# Patient Record
Sex: Female | Born: 1935 | Race: White | Hispanic: No | Marital: Married | Smoking: Never smoker
Health system: Southern US, Community
[De-identification: ages and names within clinical notes are randomized; demographics above are authoritative.]

## PROBLEM LIST (undated history)

## (undated) DIAGNOSIS — C801 Malignant (primary) neoplasm, unspecified: Secondary | ICD-10-CM

## (undated) DIAGNOSIS — K922 Gastrointestinal hemorrhage, unspecified: Secondary | ICD-10-CM

## (undated) DIAGNOSIS — Z9889 Other specified postprocedural states: Secondary | ICD-10-CM

## (undated) DIAGNOSIS — I639 Cerebral infarction, unspecified: Secondary | ICD-10-CM

## (undated) DIAGNOSIS — M1990 Unspecified osteoarthritis, unspecified site: Secondary | ICD-10-CM

## (undated) HISTORY — DX: Cerebral infarction, unspecified: I63.9

## (undated) HISTORY — PX: OTHER SURGICAL HISTORY: SHX169

## (undated) HISTORY — PX: CHOLECYSTECTOMY: SHX55

## (undated) HISTORY — PX: TONSILLECTOMY: SUR1361

## (undated) HISTORY — DX: Malignant (primary) neoplasm, unspecified: C80.1

## (undated) HISTORY — DX: Gastrointestinal hemorrhage, unspecified: K92.2

## (undated) HISTORY — DX: Other specified postprocedural states: Z98.890

## (undated) HISTORY — DX: Unspecified osteoarthritis, unspecified site: M19.90

---

## 1972-01-17 DIAGNOSIS — C801 Malignant (primary) neoplasm, unspecified: Secondary | ICD-10-CM

## 1972-01-17 HISTORY — DX: Malignant (primary) neoplasm, unspecified: C80.1

## 1997-04-23 ENCOUNTER — Encounter: Admission: RE | Admit: 1997-04-23 | Discharge: 1997-07-22 | Payer: Self-pay | Admitting: Infectious Diseases

## 1997-08-19 ENCOUNTER — Ambulatory Visit (HOSPITAL_COMMUNITY): Admission: RE | Admit: 1997-08-19 | Discharge: 1997-08-19 | Payer: Self-pay | Admitting: Internal Medicine

## 1998-10-29 ENCOUNTER — Encounter: Payer: Self-pay | Admitting: Internal Medicine

## 1998-10-29 ENCOUNTER — Ambulatory Visit (HOSPITAL_COMMUNITY): Admission: RE | Admit: 1998-10-29 | Discharge: 1998-10-29 | Payer: Self-pay | Admitting: Internal Medicine

## 2000-04-03 ENCOUNTER — Encounter: Admission: RE | Admit: 2000-04-03 | Discharge: 2000-04-20 | Payer: Self-pay | Admitting: Orthopedic Surgery

## 2000-07-26 ENCOUNTER — Other Ambulatory Visit: Admission: RE | Admit: 2000-07-26 | Discharge: 2000-07-26 | Payer: Self-pay | Admitting: Family Medicine

## 2000-08-30 ENCOUNTER — Encounter: Payer: Self-pay | Admitting: Urology

## 2000-08-31 ENCOUNTER — Ambulatory Visit (HOSPITAL_COMMUNITY): Admission: RE | Admit: 2000-08-31 | Discharge: 2000-08-31 | Payer: Self-pay | Admitting: Urology

## 2001-04-10 ENCOUNTER — Encounter: Admission: RE | Admit: 2001-04-10 | Discharge: 2001-04-10 | Payer: Self-pay | Admitting: Family Medicine

## 2001-04-10 ENCOUNTER — Encounter: Payer: Self-pay | Admitting: Family Medicine

## 2001-05-10 ENCOUNTER — Encounter: Admission: RE | Admit: 2001-05-10 | Discharge: 2001-05-10 | Payer: Self-pay | Admitting: Family Medicine

## 2001-05-10 ENCOUNTER — Encounter: Payer: Self-pay | Admitting: Family Medicine

## 2003-10-05 ENCOUNTER — Other Ambulatory Visit: Admission: RE | Admit: 2003-10-05 | Discharge: 2003-10-05 | Payer: Self-pay | Admitting: Family Medicine

## 2003-12-22 ENCOUNTER — Ambulatory Visit (HOSPITAL_COMMUNITY): Admission: RE | Admit: 2003-12-22 | Discharge: 2003-12-22 | Payer: Self-pay | Admitting: Gastroenterology

## 2003-12-22 ENCOUNTER — Encounter (INDEPENDENT_AMBULATORY_CARE_PROVIDER_SITE_OTHER): Payer: Self-pay | Admitting: *Deleted

## 2004-01-17 DIAGNOSIS — Z9889 Other specified postprocedural states: Secondary | ICD-10-CM

## 2004-01-17 HISTORY — DX: Other specified postprocedural states: Z98.890

## 2004-01-17 LAB — HM COLONOSCOPY: HM Colonoscopy: NORMAL

## 2004-06-23 ENCOUNTER — Ambulatory Visit (HOSPITAL_COMMUNITY): Admission: RE | Admit: 2004-06-23 | Discharge: 2004-06-23 | Payer: Self-pay | Admitting: *Deleted

## 2004-11-30 ENCOUNTER — Encounter: Payer: Self-pay | Admitting: Emergency Medicine

## 2004-12-01 ENCOUNTER — Encounter (INDEPENDENT_AMBULATORY_CARE_PROVIDER_SITE_OTHER): Payer: Self-pay | Admitting: *Deleted

## 2004-12-01 ENCOUNTER — Inpatient Hospital Stay (HOSPITAL_COMMUNITY): Admission: AD | Admit: 2004-12-01 | Discharge: 2004-12-02 | Payer: Self-pay | Admitting: *Deleted

## 2006-03-02 ENCOUNTER — Other Ambulatory Visit: Admission: RE | Admit: 2006-03-02 | Discharge: 2006-03-02 | Payer: Self-pay | Admitting: Family Medicine

## 2006-08-31 ENCOUNTER — Emergency Department (HOSPITAL_COMMUNITY): Admission: EM | Admit: 2006-08-31 | Discharge: 2006-09-01 | Payer: Self-pay | Admitting: Emergency Medicine

## 2006-09-12 ENCOUNTER — Encounter: Admission: RE | Admit: 2006-09-12 | Discharge: 2006-09-12 | Payer: Self-pay | Admitting: Sports Medicine

## 2006-12-05 ENCOUNTER — Encounter (INDEPENDENT_AMBULATORY_CARE_PROVIDER_SITE_OTHER): Payer: Self-pay | Admitting: Family Medicine

## 2007-03-06 ENCOUNTER — Ambulatory Visit: Payer: Self-pay | Admitting: Internal Medicine

## 2007-03-06 DIAGNOSIS — E119 Type 2 diabetes mellitus without complications: Secondary | ICD-10-CM

## 2007-03-06 DIAGNOSIS — Z8679 Personal history of other diseases of the circulatory system: Secondary | ICD-10-CM | POA: Insufficient documentation

## 2007-03-06 DIAGNOSIS — M1990 Unspecified osteoarthritis, unspecified site: Secondary | ICD-10-CM | POA: Insufficient documentation

## 2007-03-06 DIAGNOSIS — I10 Essential (primary) hypertension: Secondary | ICD-10-CM | POA: Insufficient documentation

## 2007-03-06 LAB — CONVERTED CEMR LAB
Blood in Urine, dipstick: NEGATIVE
Glucose, Urine, Semiquant: NEGATIVE
Ketones, urine, test strip: NEGATIVE
WBC Urine, dipstick: NEGATIVE

## 2007-03-13 LAB — CONVERTED CEMR LAB
ALT: 16 units/L (ref 0–35)
Albumin: 4.4 g/dL (ref 3.5–5.2)
Alkaline Phosphatase: 65 units/L (ref 39–117)
Basophils Absolute: 0.1 10*3/uL (ref 0.0–0.1)
Basophils Relative: 1 % (ref 0.0–1.0)
Bilirubin, Direct: 0.1 mg/dL (ref 0.0–0.3)
CO2: 25 meq/L (ref 19–32)
Calcium: 9.4 mg/dL (ref 8.4–10.5)
Chloride: 105 meq/L (ref 96–112)
Creatinine, Ser: 0.8 mg/dL (ref 0.4–1.2)
Eosinophils Absolute: 0 10*3/uL (ref 0.0–0.6)
Eosinophils Relative: 0.7 % (ref 0.0–5.0)
Glucose, Bld: 187 mg/dL — ABNORMAL HIGH (ref 70–99)
HCT: 44 % (ref 36.0–46.0)
Hemoglobin: 14.5 g/dL (ref 12.0–15.0)
Lymphocytes Relative: 26.3 % (ref 12.0–46.0)
MCV: 92.4 fL (ref 78.0–100.0)
RBC: 4.76 M/uL (ref 3.87–5.11)
RDW: 12.7 % (ref 11.5–14.6)
Sodium: 140 meq/L (ref 135–145)
TSH: 1.33 microintl units/mL (ref 0.35–5.50)
Total Bilirubin: 0.7 mg/dL (ref 0.3–1.2)
Total Protein: 7 g/dL (ref 6.0–8.3)

## 2007-03-25 ENCOUNTER — Ambulatory Visit: Payer: Self-pay | Admitting: Internal Medicine

## 2007-03-27 LAB — CONVERTED CEMR LAB
Creatinine, Ser: 0.7 mg/dL (ref 0.4–1.2)
GFR calc non Af Amer: 86 mL/min

## 2007-04-18 ENCOUNTER — Telehealth (INDEPENDENT_AMBULATORY_CARE_PROVIDER_SITE_OTHER): Payer: Self-pay | Admitting: *Deleted

## 2007-05-23 ENCOUNTER — Telehealth (INDEPENDENT_AMBULATORY_CARE_PROVIDER_SITE_OTHER): Payer: Self-pay | Admitting: *Deleted

## 2007-06-04 ENCOUNTER — Telehealth (INDEPENDENT_AMBULATORY_CARE_PROVIDER_SITE_OTHER): Payer: Self-pay | Admitting: *Deleted

## 2007-06-27 ENCOUNTER — Encounter: Payer: Self-pay | Admitting: Internal Medicine

## 2007-07-02 ENCOUNTER — Ambulatory Visit: Payer: Self-pay | Admitting: Internal Medicine

## 2007-07-03 ENCOUNTER — Telehealth (INDEPENDENT_AMBULATORY_CARE_PROVIDER_SITE_OTHER): Payer: Self-pay | Admitting: *Deleted

## 2007-07-03 LAB — CONVERTED CEMR LAB
HDL: 37.9 mg/dL — ABNORMAL LOW (ref >39.0)
Hgb A1c MFr Bld: 7.7 % — ABNORMAL HIGH (ref 4.6–6.0)
VLDL: 33 mg/dL (ref 0–40)

## 2007-07-08 ENCOUNTER — Encounter: Payer: Self-pay | Admitting: Internal Medicine

## 2007-07-12 ENCOUNTER — Telehealth (INDEPENDENT_AMBULATORY_CARE_PROVIDER_SITE_OTHER): Payer: Self-pay | Admitting: *Deleted

## 2007-08-07 ENCOUNTER — Telehealth: Payer: Self-pay | Admitting: Internal Medicine

## 2007-08-19 ENCOUNTER — Telehealth (INDEPENDENT_AMBULATORY_CARE_PROVIDER_SITE_OTHER): Payer: Self-pay | Admitting: *Deleted

## 2007-08-29 ENCOUNTER — Telehealth (INDEPENDENT_AMBULATORY_CARE_PROVIDER_SITE_OTHER): Payer: Self-pay | Admitting: *Deleted

## 2007-10-02 ENCOUNTER — Ambulatory Visit: Payer: Self-pay | Admitting: Internal Medicine

## 2007-10-02 DIAGNOSIS — E785 Hyperlipidemia, unspecified: Secondary | ICD-10-CM

## 2007-10-07 ENCOUNTER — Telehealth (INDEPENDENT_AMBULATORY_CARE_PROVIDER_SITE_OTHER): Payer: Self-pay | Admitting: *Deleted

## 2007-10-07 LAB — CONVERTED CEMR LAB
AST: 17 units/L (ref 0–37)
CO2: 26 meq/L (ref 19–32)
Chloride: 109 meq/L (ref 96–112)
Creatinine, Ser: 0.8 mg/dL (ref 0.4–1.2)
Hgb A1c MFr Bld: 7.5 % — ABNORMAL HIGH (ref 4.6–6.0)
Potassium: 3.6 meq/L (ref 3.5–5.1)
Sodium: 142 meq/L (ref 135–145)

## 2007-11-13 ENCOUNTER — Encounter: Payer: Self-pay | Admitting: Internal Medicine

## 2007-11-16 ENCOUNTER — Ambulatory Visit: Payer: Self-pay | Admitting: Cardiology

## 2007-11-17 ENCOUNTER — Ambulatory Visit: Payer: Self-pay | Admitting: Internal Medicine

## 2007-11-17 ENCOUNTER — Observation Stay (HOSPITAL_COMMUNITY): Admission: EM | Admit: 2007-11-17 | Discharge: 2007-11-18 | Payer: Self-pay | Admitting: Emergency Medicine

## 2007-11-17 ENCOUNTER — Ambulatory Visit: Payer: Self-pay | Admitting: Cardiology

## 2007-11-18 ENCOUNTER — Encounter: Payer: Self-pay | Admitting: Internal Medicine

## 2007-11-29 ENCOUNTER — Encounter: Payer: Self-pay | Admitting: Internal Medicine

## 2007-12-02 ENCOUNTER — Telehealth (INDEPENDENT_AMBULATORY_CARE_PROVIDER_SITE_OTHER): Payer: Self-pay | Admitting: *Deleted

## 2007-12-06 ENCOUNTER — Encounter (INDEPENDENT_AMBULATORY_CARE_PROVIDER_SITE_OTHER): Payer: Self-pay | Admitting: *Deleted

## 2007-12-19 ENCOUNTER — Ambulatory Visit: Payer: Self-pay | Admitting: Internal Medicine

## 2008-02-25 ENCOUNTER — Ambulatory Visit: Payer: Self-pay | Admitting: Internal Medicine

## 2008-03-02 ENCOUNTER — Ambulatory Visit: Payer: Self-pay | Admitting: Internal Medicine

## 2008-03-23 ENCOUNTER — Ambulatory Visit: Payer: Self-pay | Admitting: Internal Medicine

## 2008-03-25 ENCOUNTER — Telehealth: Payer: Self-pay | Admitting: Internal Medicine

## 2008-03-25 LAB — CONVERTED CEMR LAB
AST: 16 units/L (ref 0–37)
BUN: 16 mg/dL (ref 6–23)
CO2: 31 meq/L (ref 19–32)
Creatinine, Ser: 0.8 mg/dL (ref 0.4–1.2)
GFR calc Af Amer: 89 mL/min
GFR calc non Af Amer: 74 mL/min
Glucose, Bld: 98 mg/dL (ref 70–99)
Hgb A1c MFr Bld: 7.1 % — ABNORMAL HIGH (ref 4.6–6.0)
LDL Cholesterol: 119 mg/dL — ABNORMAL HIGH (ref 0–99)
Potassium: 5.4 meq/L — ABNORMAL HIGH (ref 3.5–5.1)
Sodium: 142 meq/L (ref 135–145)
VLDL: 32 mg/dL (ref 0–40)

## 2008-03-31 ENCOUNTER — Encounter (INDEPENDENT_AMBULATORY_CARE_PROVIDER_SITE_OTHER): Payer: Self-pay | Admitting: *Deleted

## 2008-03-31 ENCOUNTER — Encounter: Payer: Self-pay | Admitting: Internal Medicine

## 2008-04-08 ENCOUNTER — Telehealth: Payer: Self-pay | Admitting: Internal Medicine

## 2008-04-30 ENCOUNTER — Ambulatory Visit: Payer: Self-pay | Admitting: Internal Medicine

## 2008-05-04 ENCOUNTER — Telehealth (INDEPENDENT_AMBULATORY_CARE_PROVIDER_SITE_OTHER): Payer: Self-pay | Admitting: *Deleted

## 2008-05-12 ENCOUNTER — Telehealth (INDEPENDENT_AMBULATORY_CARE_PROVIDER_SITE_OTHER): Payer: Self-pay | Admitting: *Deleted

## 2008-05-12 ENCOUNTER — Ambulatory Visit: Payer: Self-pay | Admitting: Internal Medicine

## 2008-05-12 LAB — CONVERTED CEMR LAB
BUN: 14 mg/dL (ref 6–23)
CO2: 29 meq/L (ref 19–32)
Calcium: 9.4 mg/dL (ref 8.4–10.5)
Chloride: 108 meq/L (ref 96–112)
Creatinine, Ser: 0.7 mg/dL (ref 0.4–1.2)
GFR calc non Af Amer: 85.57 mL/min (ref >60)
Glucose, Bld: 144 mg/dL — ABNORMAL HIGH (ref 70–99)
Sodium: 145 meq/L (ref 135–145)

## 2008-05-13 ENCOUNTER — Ambulatory Visit: Payer: Self-pay | Admitting: Internal Medicine

## 2008-05-14 LAB — CONVERTED CEMR LAB: Potassium: 3.7 meq/L (ref 3.5–5.1)

## 2008-05-19 ENCOUNTER — Telehealth (INDEPENDENT_AMBULATORY_CARE_PROVIDER_SITE_OTHER): Payer: Self-pay | Admitting: *Deleted

## 2008-07-29 ENCOUNTER — Ambulatory Visit: Payer: Self-pay | Admitting: Internal Medicine

## 2008-07-29 DIAGNOSIS — M810 Age-related osteoporosis without current pathological fracture: Secondary | ICD-10-CM | POA: Insufficient documentation

## 2008-08-01 LAB — CONVERTED CEMR LAB
Basophils Absolute: 0.1 10*3/uL (ref 0.0–0.1)
Chloride: 103 meq/L (ref 96–112)
Eosinophils Relative: 1.7 % (ref 0.0–5.0)
GFR calc non Af Amer: 73.31 mL/min (ref >60)
HCT: 41.6 % (ref 36.0–46.0)
Hemoglobin: 14.3 g/dL (ref 12.0–15.0)
Hgb A1c MFr Bld: 6.8 % — ABNORMAL HIGH (ref 4.6–6.5)
Lymphs Abs: 1.7 10*3/uL (ref 0.7–4.0)
Neutro Abs: 3.2 10*3/uL (ref 1.4–7.7)
Potassium: 4.6 meq/L (ref 3.5–5.1)
Sodium: 140 meq/L (ref 135–145)
Vit D, 25-Hydroxy: 23 ng/mL — ABNORMAL LOW (ref 30–89)

## 2008-08-04 ENCOUNTER — Telehealth (INDEPENDENT_AMBULATORY_CARE_PROVIDER_SITE_OTHER): Payer: Self-pay | Admitting: *Deleted

## 2008-08-28 ENCOUNTER — Telehealth: Payer: Self-pay | Admitting: Internal Medicine

## 2008-08-31 ENCOUNTER — Ambulatory Visit: Payer: Self-pay | Admitting: Internal Medicine

## 2008-11-20 ENCOUNTER — Ambulatory Visit: Payer: Self-pay | Admitting: Internal Medicine

## 2008-11-20 DIAGNOSIS — L989 Disorder of the skin and subcutaneous tissue, unspecified: Secondary | ICD-10-CM | POA: Insufficient documentation

## 2008-11-24 ENCOUNTER — Telehealth (INDEPENDENT_AMBULATORY_CARE_PROVIDER_SITE_OTHER): Payer: Self-pay | Admitting: *Deleted

## 2008-11-24 LAB — CONVERTED CEMR LAB: AST: 14 units/L (ref 0–37)

## 2008-11-30 ENCOUNTER — Encounter: Payer: Self-pay | Admitting: Internal Medicine

## 2009-03-11 ENCOUNTER — Ambulatory Visit: Payer: Self-pay | Admitting: Internal Medicine

## 2009-03-15 LAB — CONVERTED CEMR LAB
AST: 17 units/L (ref 0–37)
BUN: 10 mg/dL (ref 6–23)
CO2: 30 meq/L (ref 19–32)
Calcium: 9.4 mg/dL (ref 8.4–10.5)
Chloride: 108 meq/L (ref 96–112)
Creatinine, Ser: 0.7 mg/dL (ref 0.4–1.2)
GFR calc non Af Amer: 85.39 mL/min (ref >60)
HDL: 50.4 mg/dL (ref >39.00)
LDL Cholesterol: 73 mg/dL (ref 0–99)
Potassium: 4.9 meq/L (ref 3.5–5.1)
Sodium: 144 meq/L (ref 135–145)
Total CHOL/HDL Ratio: 3
Triglycerides: 124 mg/dL (ref 0.0–149.0)

## 2009-03-22 ENCOUNTER — Telehealth (INDEPENDENT_AMBULATORY_CARE_PROVIDER_SITE_OTHER): Payer: Self-pay | Admitting: *Deleted

## 2009-03-24 ENCOUNTER — Encounter (INDEPENDENT_AMBULATORY_CARE_PROVIDER_SITE_OTHER): Payer: Self-pay | Admitting: *Deleted

## 2009-04-15 ENCOUNTER — Telehealth (INDEPENDENT_AMBULATORY_CARE_PROVIDER_SITE_OTHER): Payer: Self-pay | Admitting: *Deleted

## 2009-06-10 ENCOUNTER — Ambulatory Visit: Payer: Self-pay | Admitting: Internal Medicine

## 2009-08-16 DIAGNOSIS — K922 Gastrointestinal hemorrhage, unspecified: Secondary | ICD-10-CM

## 2009-08-16 HISTORY — DX: Gastrointestinal hemorrhage, unspecified: K92.2

## 2009-09-02 ENCOUNTER — Encounter: Payer: Self-pay | Admitting: Internal Medicine

## 2009-09-03 ENCOUNTER — Encounter: Payer: Self-pay | Admitting: Internal Medicine

## 2009-09-06 ENCOUNTER — Telehealth: Payer: Self-pay | Admitting: Internal Medicine

## 2009-09-09 ENCOUNTER — Ambulatory Visit: Payer: Self-pay | Admitting: Internal Medicine

## 2009-09-10 LAB — CONVERTED CEMR LAB
Eosinophils Absolute: 0.1 10*3/uL (ref 0.0–0.7)
HCT: 31.2 % — ABNORMAL LOW (ref 36.0–46.0)
Hemoglobin: 10.8 g/dL — ABNORMAL LOW (ref 12.0–15.0)
Iron: 35 ug/dL — ABNORMAL LOW (ref 42–145)
Lymphs Abs: 1.8 10*3/uL (ref 0.7–4.0)
MCV: 92.4 fL (ref 78.0–100.0)
Neutro Abs: 4.1 10*3/uL (ref 1.4–7.7)
RDW: 14.2 % (ref 11.5–14.6)
Saturation Ratios: 9.7 % — ABNORMAL LOW (ref 20.0–50.0)
Transferrin: 256.9 mg/dL (ref 212.0–360.0)
WBC: 6.5 10*3/uL (ref 4.5–10.5)

## 2009-09-14 ENCOUNTER — Ambulatory Visit: Payer: Self-pay | Admitting: Internal Medicine

## 2009-09-16 ENCOUNTER — Telehealth: Payer: Self-pay | Admitting: Internal Medicine

## 2009-09-21 ENCOUNTER — Ambulatory Visit: Payer: Self-pay | Admitting: Internal Medicine

## 2009-09-27 ENCOUNTER — Telehealth (INDEPENDENT_AMBULATORY_CARE_PROVIDER_SITE_OTHER): Payer: Self-pay | Admitting: *Deleted

## 2009-10-21 ENCOUNTER — Telehealth: Payer: Self-pay | Admitting: Internal Medicine

## 2009-10-22 ENCOUNTER — Telehealth (INDEPENDENT_AMBULATORY_CARE_PROVIDER_SITE_OTHER): Payer: Self-pay | Admitting: *Deleted

## 2009-10-27 ENCOUNTER — Encounter: Payer: Self-pay | Admitting: Internal Medicine

## 2009-10-29 ENCOUNTER — Ambulatory Visit: Payer: Self-pay | Admitting: Internal Medicine

## 2009-11-01 ENCOUNTER — Ambulatory Visit: Payer: Self-pay | Admitting: Internal Medicine

## 2009-11-19 ENCOUNTER — Telehealth: Payer: Self-pay | Admitting: Internal Medicine

## 2009-11-19 ENCOUNTER — Encounter: Payer: Self-pay | Admitting: Internal Medicine

## 2009-12-01 ENCOUNTER — Encounter: Payer: Self-pay | Admitting: Internal Medicine

## 2009-12-01 LAB — HM MAMMOGRAPHY: HM Mammogram: NORMAL

## 2010-02-10 ENCOUNTER — Ambulatory Visit: Admit: 2010-02-10 | Payer: Self-pay | Admitting: Internal Medicine

## 2010-02-16 ENCOUNTER — Other Ambulatory Visit: Payer: Self-pay | Admitting: Internal Medicine

## 2010-02-16 ENCOUNTER — Ambulatory Visit (INDEPENDENT_AMBULATORY_CARE_PROVIDER_SITE_OTHER): Payer: MEDICARE | Admitting: Internal Medicine

## 2010-02-16 ENCOUNTER — Encounter: Payer: Self-pay | Admitting: Internal Medicine

## 2010-02-16 DIAGNOSIS — E119 Type 2 diabetes mellitus without complications: Secondary | ICD-10-CM

## 2010-02-16 DIAGNOSIS — I10 Essential (primary) hypertension: Secondary | ICD-10-CM

## 2010-02-16 DIAGNOSIS — R3919 Other difficulties with micturition: Secondary | ICD-10-CM | POA: Insufficient documentation

## 2010-02-16 DIAGNOSIS — E785 Hyperlipidemia, unspecified: Secondary | ICD-10-CM

## 2010-02-16 LAB — CONVERTED CEMR LAB
Glucose, Urine, Semiquant: NEGATIVE
Specific Gravity, Urine: 1.015
WBC Urine, dipstick: NEGATIVE
pH: 6.5

## 2010-02-16 LAB — BASIC METABOLIC PANEL
CO2: 27 mEq/L (ref 19–32)
Chloride: 107 mEq/L (ref 96–112)
Sodium: 141 mEq/L (ref 135–145)

## 2010-02-16 LAB — LIPID PANEL
Cholesterol: 200 mg/dL (ref 0–200)
LDL Cholesterol: 132 mg/dL — ABNORMAL HIGH (ref 0–99)
Total CHOL/HDL Ratio: 5
Triglycerides: 131 mg/dL (ref 0.0–149.0)

## 2010-02-17 NOTE — Assessment & Plan Note (Signed)
Summary: 6 WEEK FOLLOWUP--FASTING LABS??///SPH   Vital Signs:  Patient profile:   77 year old female Weight:      215.50 pounds Pulse rate:   73 / minute Pulse rhythm:   regular BP sitting:   182 / 90  (left arm) Cuff size:   regular  Vitals Entered By: Army Fossa CMA (October 29, 2009 9:03 AM) CC: Pt here for f/u visit- fasting Comments Started losartan this am.  [facility]    History of Present Illness: routine office visit Have extensive paperwork for me to fill out.  ROS As far as her diabetes, she is taking 25 units in the morning and 20 at night. She only recalls her CBGs from yesterday morning (130) and today morning (150) since last ov had a single episode of low sugar today at 3 am   As far as her HTN,  today her BP  is elevated, a week ago at home her systolic BP was 139 and it usually stays in that range. Good compliance with Diovan, she took her last pill today. His  will  start losartan tonight.  She already stopped PPIs as recommended at the last visit. No GERD symptoms, no nausea vomiting or blood in the stools     Current Medications (verified): 1)  Simvastatin 20 Mg Tabs (Simvastatin) .... Take 1 Tablet By Mouth Once A Day 2)  Novolin 70/30 70-30 %  Susp (Insulin Isophane & Regular) .... 24 Units in Am, 20 Units At  Pm 3)  Furosemide 40 Mg  Tabs (Furosemide) .Marland Kitchen.. 1 By Mouth Once Daily 4)  Cartia Xt 300 Mg Xr24h-Cap (Diltiazem Hcl Coated Beads) .Marland Kitchen.. 1 By Mouth Once Daily 5)  Allegra 180 Mg  Tabs (Fexofenadine Hcl) .Marland Kitchen.. 1 By Mouth Once Daily 6)  Tylenol Arthritis Pain 650 Mg  Tbcr (Acetaminophen) .... By Mouth Every 6-7 Hours 7)  Calcium/d 600mg  .... Take 1 Tablet By Mouth Two Times A Day 8)  Flonase 50 Mcg/act Susp (Fluticasone Propionate) .... 2 Sprays On Each Side of The Nose Once Daily 9)  Miralax  Pack (Polyethylene Glycol 3350) 10)  Ra Col-Rite 100 Mg Caps (Docusate Sodium) 11)  Aspirin 81 Mg Tbec (Aspirin) .Marland Kitchen.. 1 in The Morning With  Food 12)  Losartan Potassium 50 Mg Tabs (Losartan Potassium) .Marland Kitchen.. 1 By Mouth Two Times A Day. 13)  Glucose Tablets  Allergies: 1)  ! Pcn  Past History:  Past Medical History: Diabetes mellitus, type II Hypertension Hyperlipidemia  Osteoarthritis Cancer 1974 (Type??) s/p removal of the 3th R rib Cerebrovascular accident, hx of  (x2) carotid u/s and ECHO 11-09 essentially neg, EF 55% 2006 cardiac cath @ [facility] : non critical dz, EF 65% h/o Gilford Silvius (was told not to take  pneumonia-flu shots) Osteoporosis GI bleed 08-2009, admited to H.P.  Past Surgical History: Reviewed history from 03/06/2007 and no changes required. Tonsillectomy 3RD RIB-RIGHT SIDE REMOVED (DUE TO CANCER) Cholecystectomy 7 NATURAL BIRTHS  Social History: Reviewed history from 03/23/2008 and no changes required. Married 7children moved from the [place] area 1952 diet-- "trying to do better" exercise--not very active lately   Physical Exam  General:  she is noted to be alert, oriented x3 , in no apparent distress Lungs:  normal respiratory effort, no intercostal retractions, no accessory muscle use, and normal breath sounds.   Heart:  normal rate, regular rhythm, no murmur, and no gallop.   Extremities:  +/+++  symmetric pitting  edema at the distal lower extremities  Impression & Recommendations:  Problem # 1:  GI BLEEDING (ICD-578.9) rechecking her hemoglobin today. Patient asymptomatic, off PPIs  Orders: TLB-Hemoglobin (Hgb) (85018-HGB)  Problem # 2:  DIABETES MELLITUS, TYPE II (ICD-250.00) currently taking 25 units in the morning and 20 the afternoon CBGs in the morning slightly high We'll check her hemoglobin A1c and decide if a change in insulin is needed  Her updated medication list for this problem includes:    Novolin 70/30 70-30 % Susp (Insulin isophane & regular) .Marland Kitchen... 25  units in am, 20 units at  pm    Losartan Potassium 50 Mg Tabs (Losartan potassium) .Marland Kitchen... 1 by mouth two times  a day.    Aspirin 81 Mg Tbec (Aspirin) .Marland Kitchen... 1 in the morning with food  Orders: Venipuncture (20254) TLB-A1C / Hgb A1C (Glycohemoglobin) (83036-A1C)  Problem # 3:  HYPERTENSION (ICD-401.9) mild edema, consider increasing Lasix, check a TSH  Due to cost, we're switching from Diovan to losartan  today. see instructions, will need a nurse visit in a month The following medications were removed from the medication list:    Diovan 160 Mg Tabs (Valsartan) .Marland Kitchen... 1 two times a day Her updated medication list for this problem includes:    Furosemide 40 Mg Tabs (Furosemide) .Marland Kitchen... 1 by mouth once daily    Cartia Xt 300 Mg Xr24h-cap (Diltiazem hcl coated beads) .Marland Kitchen... 1 by mouth once daily    Losartan Potassium 50 Mg Tabs (Losartan potassium) .Marland Kitchen... 1 by mouth two times a day.  Orders: TLB-TSH (Thyroid Stimulating Hormone) (84443-TSH) Specimen Handling (27062)  Problem # 4:  face-to-face >> 25 minutes, extensive forms for the [facility] department filled , note from her ophthalmologist also reviewed  Complete Medication List: 1)  Simvastatin 20 Mg Tabs (Simvastatin) .... Take 1 tablet by mouth once a day 2)  Novolin 70/30 70-30 % Susp (Insulin isophane & regular) .... 25  units in am, 20 units at  pm 3)  Furosemide 40 Mg Tabs (Furosemide) .Marland Kitchen.. 1 by mouth once daily 4)  Cartia Xt 300 Mg Xr24h-cap (Diltiazem hcl coated beads) .Marland Kitchen.. 1 by mouth once daily 5)  Losartan Potassium 50 Mg Tabs (Losartan potassium) .Marland Kitchen.. 1 by mouth two times a day. 6)  Allegra 180 Mg Tabs (Fexofenadine hcl) .Marland Kitchen.. 1 by mouth once daily 7)  Tylenol Arthritis Pain 650 Mg Tbcr (Acetaminophen) .... By mouth every 6-7 hours 8)  Calcium/d 600mg   .... Take 1 tablet by mouth two times a day 9)  Flonase 50 Mcg/act Susp (Fluticasone propionate) .... 2 sprays on each side of the nose once daily 10)  Miralax Pack (Polyethylene glycol 3350) 11)  Ra Col-rite 100 Mg Caps (Docusate sodium) 12)  Aspirin 81 Mg Tbec (Aspirin) .Marland Kitchen.. 1 in the morning  with food 13)  Glucose Tablets   Patient Instructions: 1)  start losartan as planned  2)  keep a log  of your BPs 3)  Keep a log of your blood sugars, check at least twice a day 4)  make an appointment for a nurse visit to be done in 3 weeks. Bring your BP and sugar readings. You will need a BMP and a BP check and at the time of the nurse visit 5)  Please schedule a follow-up appointment with me in 3 months .

## 2010-02-17 NOTE — Letter (Signed)
Summary: Medical Report Form/[facility]  Medical Report Form/[facility]   Imported By: Lanelle Bal 11/04/2009 15:34:33  _____________________________________________________________________  External Attachment:    Type:   Image     Comment:   External Document

## 2010-02-17 NOTE — Letter (Signed)
Summary: [facility]   Imported By: Lanelle Bal 09/13/2009 12:52:08  _____________________________________________________________________  External Attachment:    Type:   Image     Comment:   External Document

## 2010-02-17 NOTE — Progress Notes (Signed)
  Phone Note Outgoing Call   Summary of Call: ambulatory  BPs  interviewed, SBP in the 160 range, DBPs ok; pulse in the 60s and 70s  Please ask the patient if she got a new BP machine (old was decalibrated) Rithwik Schmieg E. Sael Furches MD  November 19, 2009 3:09 PM   Follow-up for Phone Call        Pt states that she did get a new BP machine. Army Fossa CMA  November 19, 2009 3:13 PM   Additional Follow-up for Phone Call Additional follow up Details #1::        needs better control, add:  Clonidine 0.2 mg one p.o. b.i.d.Marland Kitchen #60, 2 RF call w/  BP readings in 2 weeks  Olimpia Tinch E. Eloisa Chokshi MD  November 19, 2009 3:46 PM     Additional Follow-up for Phone Call Additional follow up Details #2::    Spoke with pt she is aware.  Follow-up by: Army Fossa CMA,  November 19, 2009 3:51 PM  New/Updated Medications: CLONIDINE HCL 0.2 MG TABS (CLONIDINE HCL) 1 by mouth two times a day. Prescriptions: CLONIDINE HCL 0.2 MG TABS (CLONIDINE HCL) 1 by mouth two times a day.  #60 x 2   Entered by:   Army Fossa CMA   Authorized by:   Nolon Rod. Jahking Lesser MD   Signed by:   Army Fossa CMA on 11/19/2009   Method used:   Electronically to        [facility]* (retail)       [address]       [address]       [place], [place]  [address]       Ph: [phone]       Fax: [phone]   RxID:   [id number]

## 2010-02-17 NOTE — Letter (Signed)
Summary: Marlaine Hind MD Optometrist  Marlaine Hind MD Optometrist   Imported By: Lanelle Bal 11/04/2009 15:35:18  _____________________________________________________________________  External Attachment:    Type:   Image     Comment:   External Document

## 2010-02-17 NOTE — Progress Notes (Signed)
Summary: SAMPLES  Phone Note Call from Patient   Caller: Patient Summary of Call: PT IS REQUESTING SAMPLES OF DIOVAN. PLEASE HAVE THEM UP FRONT FOR BY LUNCHTIME Initial call taken by: Lavell Islam,  October 22, 2009 10:04 AM  Follow-up for Phone Call        Patient walked in and samples given Follow-up by: Shonna Chock CMA,  October 22, 2009 10:32 AM

## 2010-02-17 NOTE — Progress Notes (Signed)
Summary: pt status  ---- Converted from flag ---- ---- 09/14/2009 2:06 PM, Taziyah Iannuzzi E. Jaamal Farooqui MD wrote: please check of her, had  throat pain, better?? ------------------------------  Pt states that she is doing so much better! Army Fossa CMA  September 16, 2009 11:29 AM

## 2010-02-17 NOTE — Assessment & Plan Note (Signed)
Summary: f/u med center/cbs   Vital Signs:  Patient profile:   77 year old female Weight:      215.38 pounds Pulse rate:   67 / minute Pulse rhythm:   regular BP sitting:   128 / 84  (left arm) Cuff size:   regular  Vitals Entered By: Army Fossa CMA (September 09, 2009 10:15 AM) CC: F/u from Med Center   History of Present Illness: followup from a hospital admission extensive records are reviewed She was admitted from 25-17 to 8-19  with GI bleed, she had dark stools. Her initial hemoglobin was 13.0, reportedly her hemoglobin dropped to 7.9 She had one PRBC and one unit of platelets Creatinine was 0.6, potassium 3.7 GI was consulted EGD was normal  Colonoscopy showed multiple polyps, some of them large according to the patient. They were tubular adenomas and tubulovillous adenomas She was discharged on no Aggrenox.  ROS Since she left the hospital she feels very tired, her appetite is good and is tolerating food okay. She has some mild ill-defined abdominal discomfort No nausea, vomiting, diarrhea Prior to admission, she was taking 20 units of insulin in the morning and 20 in the afternoon At the present time she is eating less than prior to admission (I will adjust her insulin accordingly)  Current Medications (verified): 1)  Simvastatin 20 Mg Tabs (Simvastatin) .... Take 1 Tablet By Mouth Once A Day 2)  Novolin 70/30 70-30 %  Susp (Insulin Isophane & Regular) .... 30 in Am, 25 At Pm 3)  Furosemide 40 Mg  Tabs (Furosemide) .Marland Kitchen.. 1 By Mouth Once Daily 4)  Cartia Xt 300 Mg Xr24h-Cap (Diltiazem Hcl Coated Beads) .Marland Kitchen.. 1 By Mouth Once Daily 5)  Allegra 180 Mg  Tabs (Fexofenadine Hcl) .Marland Kitchen.. 1 By Mouth Once Daily 6)  Tylenol Arthritis Pain 650 Mg  Tbcr (Acetaminophen) .... By Mouth Every 6-7 Hours 7)  Calcium/d 600mg  .... Take 1 Tablet By Mouth Two Times A Day 8)  Diovan 160 Mg Tabs (Valsartan) .Marland Kitchen.. 1 Two Times A Day 9)  Flonase 50 Mcg/act Susp (Fluticasone Propionate) .... 2  Sprays On Each Side of The Nose Once Daily 10)  Anucort-Hc 25 Mg Supp (Hydrocortisone Acetate) .Marland Kitchen.. 1 Supp Rectally As Needed 11)  Ferrocite Plus  Tabs (B Complex-C-Min-Fe-Fa) .... Once Daily 12)  Miralax  Pack (Polyethylene Glycol 3350) 13)  Ra Col-Rite 100 Mg Caps (Docusate Sodium) 14)  Protonix 40 Mg Tbec (Pantoprazole Sodium) .... Once Daily  Allergies: 1)  ! Pcn  Past History:  Past Medical History: Diabetes mellitus, type II Hypertension Hyperlipidemia  Osteoarthritis Cancer 1974 (Type??) s/p removal of the 3th R rib Cerebrovascular accident, hx of  (x2) carotid u/s and ECHO 11-09 essentially neg, EF 55% 2006 cardiac cath @ [facility] : non critical dz, EF 65% h/o Gilford Silvius (was told not to take  pneumonia-flu shots) Osteoporosis GI bleed 08-2009, admited to H.P.  Past Surgical History: Reviewed history from 03/06/2007 and no changes required. Tonsillectomy 3RD RIB-RIGHT SIDE REMOVED (DUE TO CANCER) Cholecystectomy 7 NATURAL BIRTHS  Social History: Reviewed history from 03/23/2008 and no changes required. Married 7children moved from the [place] area 1952 diet-- "trying to do better" exercise--not very active lately   Physical Exam  General:  alert, well-developed, and overweight-appearing.  alert and well-developed.   Lungs:  normal respiratory effort, no intercostal retractions, no accessory muscle use, and normal breath sounds.   Heart:  normal rate, regular rhythm, no murmur, and no gallop.   Abdomen:  soft, non-tender, no distention, no masses, no guarding, and no rigidity.  soft, non-tender, no distention, no masses, no guarding, and no rigidity.   Extremities:  mild symmetric pitting  edema at the distal lower extremities Psych:  Oriented X3, memory intact for recent and remote, normally interactive, good eye contact, and not depressed appearing.  slightly anxious    Impression & Recommendations:  Problem # 1:  GI BLEEDING (ICD-578.9)  status post  admission for GI bleed, hemoglobin dropped as low as 7.9 per discharge summary. EGD normal Colonoscopy showed several polyps plan: She was prescribed Protonix, we'll continue with it Continue iron Labs today has no f/u w/  GI per patient  Orders: Venipuncture (98119) TLB-CBC Platelet - w/Differential (85025-CBCD) TLB-IBC Pnl (Iron/FE;Transferrin) (83550-IBC) Specimen Handling (14782)  Problem # 2:  DIABETES MELLITUS, TYPE II (ICD-250.00) prior to admission she was taking 28 units in the morning and 20 the afternoon, right now her diet is different, she is eating less than before. Plan: Will decrease insulin to 20 units in the morning and 18 in the afternoon. Patient will call with CBGs in one week Her updated medication list for this problem includes:    Novolin 70/30 70-30 % Susp (Insulin isophane & regular) .Marland Kitchen... 20 in am, 18  at pm    Diovan 160 Mg Tabs (Valsartan) .Marland Kitchen... 1 two times a day    Aspirin 81 Mg Tbec (Aspirin) .Marland Kitchen... 1 in the morning with food  Problem # 3:  HYPERTENSION (ICD-401.9)  BP well controlled today Her updated medication list for this problem includes:    Furosemide 40 Mg Tabs (Furosemide) .Marland Kitchen... 1 by mouth once daily    Cartia Xt 300 Mg Xr24h-cap (Diltiazem hcl coated beads) .Marland Kitchen... 1 by mouth once daily    Diovan 160 Mg Tabs (Valsartan) .Marland Kitchen... 1 two times a day  BP today: 128/84 Prior BP: 140/68 (06/10/2009)  Labs Reviewed: K+: 4.9 (03/11/2009) Creat: : 0.7 (03/11/2009)   Chol: 148 (03/11/2009)   HDL: 50.40 (03/11/2009)   LDL: 73 (03/11/2009)   TG: 124.0 (03/11/2009)  Her updated medication list for this problem includes:    Furosemide 40 Mg Tabs (Furosemide) .Marland Kitchen... 1 by mouth once daily    Cartia Xt 300 Mg Xr24h-cap (Diltiazem hcl coated beads) .Marland Kitchen... 1 by mouth once daily    Diovan 160 Mg Tabs (Valsartan) .Marland Kitchen... 1 two times a day  Problem # 4:  CEREBROVASCULAR ACCIDENT, HX OF (ICD-V12.50) Aggrenox has been stopped due to moderate to severe GI bleed Will  recommend aspirin 81 mg once a day starting next week    Complete Medication List: 1)  Simvastatin 20 Mg Tabs (Simvastatin) .... Take 1 tablet by mouth once a day 2)  Novolin 70/30 70-30 % Susp (Insulin isophane & regular) .... 20 in am, 18  at pm 3)  Furosemide 40 Mg Tabs (Furosemide) .Marland Kitchen.. 1 by mouth once daily 4)  Cartia Xt 300 Mg Xr24h-cap (Diltiazem hcl coated beads) .Marland Kitchen.. 1 by mouth once daily 5)  Allegra 180 Mg Tabs (Fexofenadine hcl) .Marland Kitchen.. 1 by mouth once daily 6)  Tylenol Arthritis Pain 650 Mg Tbcr (Acetaminophen) .... By mouth every 6-7 hours 7)  Calcium/d 600mg   .... Take 1 tablet by mouth two times a day 8)  Diovan 160 Mg Tabs (Valsartan) .Marland Kitchen.. 1 two times a day 9)  Flonase 50 Mcg/act Susp (Fluticasone propionate) .... 2 sprays on each side of the nose once daily 10)  Anucort-hc 25 Mg Supp (Hydrocortisone acetate) .Marland Kitchen.. 1 supp  rectally as needed 11)  Ferrocite Plus Tabs (B complex-c-min-fe-fa) .... Once daily 12)  Miralax Pack (Polyethylene glycol 3350) 13)  Ra Col-rite 100 Mg Caps (Docusate sodium) 14)  Protonix 40 Mg Tbec (Pantoprazole sodium) .... Once daily 15)  Aspirin 81 Mg Tbec (Aspirin) .Marland Kitchen.. 1 in the morning with food  Patient Instructions: 1)  new insulin dose 2)  20 units in the morning and 18 in the afternoon.  3)  call with sugar readings  in one week 4)  start aspirin 81 mg one daily with food next week. 5)  Please schedule a follow-up appointment in 1 month.

## 2010-02-17 NOTE — Assessment & Plan Note (Signed)
Summary: 1 WEEK RETURN/RH......Marland Kitchen   Vital Signs:  Patient profile:   76 year old female Weight:      216.13 pounds Pulse rate:   76 / minute Pulse rhythm:   regular BP sitting:   130 / 82  (left arm) Cuff size:   regular  Vitals Entered By: Army Fossa CMA (September 21, 2009 3:39 PM)  CC: Pt here for 1 week f/u Comments Discuss BS.   History of Present Illness: she was seen a week ago with severe, sudden throat pain. After 2 days, she started to feel much better At the present time she feels back to normal  ROS Denies any nausea, vomiting No diarrhea or blood in the stools Denies abdominal pain She experienced heartburn once or twice in the last few days Ambulatory CBGs in the morning are 150-160, in the afternoon around 150  Current Medications (verified): 1)  Simvastatin 20 Mg Tabs (Simvastatin) .... Take 1 Tablet By Mouth Once A Day 2)  Novolin 70/30 70-30 %  Susp (Insulin Isophane & Regular) .... 20 in Am, 18  At Pm 3)  Furosemide 40 Mg  Tabs (Furosemide) .Marland Kitchen.. 1 By Mouth Once Daily 4)  Cartia Xt 300 Mg Xr24h-Cap (Diltiazem Hcl Coated Beads) .Marland Kitchen.. 1 By Mouth Once Daily 5)  Allegra 180 Mg  Tabs (Fexofenadine Hcl) .Marland Kitchen.. 1 By Mouth Once Daily 6)  Tylenol Arthritis Pain 650 Mg  Tbcr (Acetaminophen) .... By Mouth Every 6-7 Hours 7)  Calcium/d 600mg  .... Take 1 Tablet By Mouth Two Times A Day 8)  Diovan 160 Mg Tabs (Valsartan) .Marland Kitchen.. 1 Two Times A Day 9)  Flonase 50 Mcg/act Susp (Fluticasone Propionate) .... 2 Sprays On Each Side of The Nose Once Daily 10)  Anucort-Hc 25 Mg Supp (Hydrocortisone Acetate) .Marland Kitchen.. 1 Supp Rectally As Needed 11)  Ferrocite Plus  Tabs (B Complex-C-Min-Fe-Fa) .... Once Daily 12)  Miralax  Pack (Polyethylene Glycol 3350) 13)  Ra Col-Rite 100 Mg Caps (Docusate Sodium) 14)  Protonix 40 Mg Tbec (Pantoprazole Sodium) .... Once Daily 15)  Aspirin 81 Mg Tbec (Aspirin) .Marland Kitchen.. 1 in The Morning With Food  Allergies: 1)  ! Pcn  Past History:  Past Medical  History: Reviewed history from 09/09/2009 and no changes required. Diabetes mellitus, type II Hypertension Hyperlipidemia  Osteoarthritis Cancer 1974 (Type??) s/p removal of the 3th R rib Cerebrovascular accident, hx of  (x2) carotid u/s and ECHO 11-09 essentially neg, EF 55% 2006 cardiac cath @ [facility] : non critical dz, EF 65% h/o Gilford Silvius (was told not to take  pneumonia-flu shots) Osteoporosis GI bleed 08-2009, admited to H.P.  Past Surgical History: Reviewed history from 03/06/2007 and no changes required. Tonsillectomy 3RD RIB-RIGHT SIDE REMOVED (DUE TO CANCER) Cholecystectomy 7 NATURAL BIRTHS  Social History: Reviewed history from 03/23/2008 and no changes required. Married 7children moved from the [place] area 1952 diet-- "trying to do better" exercise--not very active lately   Physical Exam  General:  alert and well-developed.  no apparent distress Lungs:  normal respiratory effort, no intercostal retractions, no accessory muscle use, and normal breath sounds.   Heart:  normal rate, regular rhythm, no murmur, and no gallop.   Abdomen:  soft, non-tender, no distention, no masses, no guarding, and no rigidity.     Impression & Recommendations:  Problem # 1:  THROAT PAIN (ICD-784.1) resolved  Likely symptoms were related to  acid reflux see #2  Problem # 2:  GI BLEEDING (ICD-578.9) on Protonix since she was admitted  with a GI bleed recently temporarily took 2 tablets daily due to throat pain. cost is a concern  Will recommend a total of one month of PPIs, discontinue Protonix 10-02-09. Watch for resurfacing of symptoms  Problem # 3:  DIABETES MELLITUS, TYPE II (ICD-250.00)  see  review of systems, room for improvement, increase insulin from 20 -- 18, to 24 -- 20 Her updated medication list for this problem includes:    Novolin 70/30 70-30 % Susp (Insulin isophane & regular) .Marland Kitchen... 24 units in am, 20 units at  pm    Diovan 160 Mg Tabs (Valsartan) .Marland Kitchen... 1 two  times a day    Aspirin 81 Mg Tbec (Aspirin) .Marland Kitchen... 1 in the morning with food  Labs Reviewed: Creat: 0.7 (03/11/2009)    Reviewed HgBA1c results: 6.9 (03/11/2009)  7.1 (11/20/2008)  Complete Medication List: 1)  Simvastatin 20 Mg Tabs (Simvastatin) .... Take 1 tablet by mouth once a day 2)  Novolin 70/30 70-30 % Susp (Insulin isophane & regular) .... 24 units in am, 20 units at  pm 3)  Furosemide 40 Mg Tabs (Furosemide) .Marland Kitchen.. 1 by mouth once daily 4)  Cartia Xt 300 Mg Xr24h-cap (Diltiazem hcl coated beads) .Marland Kitchen.. 1 by mouth once daily 5)  Allegra 180 Mg Tabs (Fexofenadine hcl) .Marland Kitchen.. 1 by mouth once daily 6)  Tylenol Arthritis Pain 650 Mg Tbcr (Acetaminophen) .... By mouth every 6-7 hours 7)  Calcium/d 600mg   .... Take 1 tablet by mouth two times a day 8)  Diovan 160 Mg Tabs (Valsartan) .Marland Kitchen.. 1 two times a day 9)  Flonase 50 Mcg/act Susp (Fluticasone propionate) .... 2 sprays on each side of the nose once daily 10)  Anucort-hc 25 Mg Supp (Hydrocortisone acetate) .Marland Kitchen.. 1 supp rectally as needed 11)  Ferrocite Plus Tabs (B complex-c-min-fe-fa) .... Once daily 12)  Miralax Pack (Polyethylene glycol 3350) 13)  Ra Col-rite 100 Mg Caps (Docusate sodium) 14)  Protonix 40 Mg Tbec (Pantoprazole sodium) .... Once daily 15)  Aspirin 81 Mg Tbec (Aspirin) .Marland Kitchen.. 1 in the morning with food  Patient Instructions: 1)  increase her insulin to:24 units in the morning -- 20 units in the afternoon 2)  Go back to Protonix (pantoprazole) once daily until September 17, then use it as needed. 3)  Please come back for followup in 6 weeks Prescriptions: PROTONIX 40 MG TBEC (PANTOPRAZOLE SODIUM) once daily  #30 x 1   Entered and Authorized by:   Elita Quick E. Paz MD   Signed by:   Nolon Rod. Paz MD on 09/21/2009   Method used:   Electronically to        [id number]* (retail)       [address]       [place], [place]  [address]       Ph: [phone] or [phone]       Fax:  [phone]   RxID:   [id number]

## 2010-02-17 NOTE — Procedures (Signed)
Summary: Colonoscopy/[facility]   Imported By: Lanelle Bal 09/16/2009 10:04:04  _____________________________________________________________________  External Attachment:    Type:   Image     Comment:   External Document
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
## 2010-02-17 NOTE — Assessment & Plan Note (Signed)
Summary: TROUBLE SWALLOWING/NTA   Vital Signs:  Patient profile:   77 year old female Weight:      216 pounds Temp:     97.0 degrees F Pulse rate:   82 / minute Pulse rhythm:   regular BP sitting:   130 / 82  (left arm) Cuff size:   regular  Vitals Entered By: Army Fossa CMA (September 14, 2009 8:31 AM) CC: Trouble with Tongue and Throat Comments Woke up at 4 am and her tongue was hanging out of her mouth, states she has horrible pain in her throat. Bad indegestion.    History of Present Illness: woke up at 4 AM this morning with pain in her throat, it feels "raw", she was able to swallow her medication and some coffee. The pain is constant, did not get worse with swallowing her medication, no odynophagia. denies any new medicines, having some runny nose but no other signs of a cold  ROS no fever or  drooling Developed "indigestion" described as a "raw feeling and noises"   in the upper abdomen with some radiation upward Some nausea but no vomiting, no diarrhea Cough at baseline Denies substernal chest pain   Current Medications (verified): 1)  Simvastatin 20 Mg Tabs (Simvastatin) .... Take 1 Tablet By Mouth Once A Day 2)  Novolin 70/30 70-30 %  Susp (Insulin Isophane & Regular) .... 20 in Am, 18  At Pm 3)  Furosemide 40 Mg  Tabs (Furosemide) .Marland Kitchen.. 1 By Mouth Once Daily 4)  Cartia Xt 300 Mg Xr24h-Cap (Diltiazem Hcl Coated Beads) .Marland Kitchen.. 1 By Mouth Once Daily 5)  Allegra 180 Mg  Tabs (Fexofenadine Hcl) .Marland Kitchen.. 1 By Mouth Once Daily 6)  Tylenol Arthritis Pain 650 Mg  Tbcr (Acetaminophen) .... By Mouth Every 6-7 Hours 7)  Calcium/d 600mg  .... Take 1 Tablet By Mouth Two Times A Day 8)  Diovan 160 Mg Tabs (Valsartan) .Marland Kitchen.. 1 Two Times A Day 9)  Flonase 50 Mcg/act Susp (Fluticasone Propionate) .... 2 Sprays On Each Side of The Nose Once Daily 10)  Anucort-Hc 25 Mg Supp (Hydrocortisone Acetate) .Marland Kitchen.. 1 Supp Rectally As Needed 11)  Ferrocite Plus  Tabs (B Complex-C-Min-Fe-Fa) .... Once  Daily 12)  Miralax  Pack (Polyethylene Glycol 3350) 13)  Ra Col-Rite 100 Mg Caps (Docusate Sodium) 14)  Protonix 40 Mg Tbec (Pantoprazole Sodium) .... Once Daily 15)  Aspirin 81 Mg Tbec (Aspirin) .Marland Kitchen.. 1 in The Morning With Food  Allergies: 1)  ! Pcn  Past History:  Past Medical History: Reviewed history from 09/09/2009 and no changes required. Diabetes mellitus, type II Hypertension Hyperlipidemia  Osteoarthritis Cancer 1974 (Type??) s/p removal of the 3th R rib Cerebrovascular accident, hx of  (x2) carotid u/s and ECHO 11-09 essentially neg, EF 55% 2006 cardiac cath @ [facility] : non critical dz, EF 65% h/o Gilford Silvius (was told not to take  pneumonia-flu shots) Osteoporosis GI bleed 08-2009, admited to H.P.  Past Surgical History: Reviewed history from 03/06/2007 and no changes required. Tonsillectomy 3RD RIB-RIGHT SIDE REMOVED (DUE TO CANCER) Cholecystectomy 7 NATURAL BIRTHS  Social History: Reviewed history from 03/23/2008 and no changes required. Married 7children moved from the [place] area 1952 diet-- "trying to do better" exercise--not very active lately   Physical Exam  General:  alert and well-developed.  no respiratory distress or stridor Head:  face symmetric Nose:  not congested Mouth:  no redness or discharge, throat seems normal to inspection. No drooling, no trismus Neck:  neck is symmetric, full  range of motion, no mass, no crepitus Lungs:  normal respiratory effort, no intercostal retractions, no accessory muscle use, and normal breath sounds.  no increased work of breathing Heart:  normal rate, regular rhythm, no murmur, and no gallop.   Abdomen:  soft, no distention, no masses, no guarding, and no rigidity.  slight increase bowel sounds   Impression & Recommendations:  Problem # 1:  THROAT PAIN (ICD-784.1)  on exam, there is no evidence of acute pharyngitis or peritonsillar abscess Symptoms are associated with "indigestion" and described as a  "raw feeling" EKG  sinus rhythm, no acute findings, no old EKGs to compare with Most likely symptoms are related to the GI tract, acute reflux? Plan: Soft tissue neck x-ray Chest x-ray increase PPIs to b.i.d. for few days ER if symptoms severe, difficulty swallowing, drooling, chest pain, shortness of breath patient verbalized understanding  Orders: T-2 View CXR (71020TC) T-Cervicle Spine 2-3 Views (72040TC) EKG w/ Interpretation (93000)  Complete Medication List: 1)  Simvastatin 20 Mg Tabs (Simvastatin) .... Take 1 tablet by mouth once a day 2)  Novolin 70/30 70-30 % Susp (Insulin isophane & regular) .... 20 in am, 18  at pm 3)  Furosemide 40 Mg Tabs (Furosemide) .Marland Kitchen.. 1 by mouth once daily 4)  Cartia Xt 300 Mg Xr24h-cap (Diltiazem hcl coated beads) .Marland Kitchen.. 1 by mouth once daily 5)  Allegra 180 Mg Tabs (Fexofenadine hcl) .Marland Kitchen.. 1 by mouth once daily 6)  Tylenol Arthritis Pain 650 Mg Tbcr (Acetaminophen) .... By mouth every 6-7 hours 7)  Calcium/d 600mg   .... Take 1 tablet by mouth two times a day 8)  Diovan 160 Mg Tabs (Valsartan) .Marland Kitchen.. 1 two times a day 9)  Flonase 50 Mcg/act Susp (Fluticasone propionate) .... 2 sprays on each side of the nose once daily 10)  Anucort-hc 25 Mg Supp (Hydrocortisone acetate) .Marland Kitchen.. 1 supp rectally as needed 11)  Ferrocite Plus Tabs (B complex-c-min-fe-fa) .... Once daily 12)  Miralax Pack (Polyethylene glycol 3350) 13)  Ra Col-rite 100 Mg Caps (Docusate sodium) 14)  Protonix 40 Mg Tbec (Pantoprazole sodium) .... Once daily 15)  Aspirin 81 Mg Tbec (Aspirin) .Marland Kitchen.. 1 in the morning with food  Patient Instructions: 1)  Go to the ER if you have severe symptoms, unable to swallow, difficulty breathing 2)  Take Protonix twice a day for one week, always on empty stomach

## 2010-02-17 NOTE — Assessment & Plan Note (Signed)
Summary: WALKIN/ BP HIGH/ADD PER DANIELLE/nurse visit only/kn  Nurse Visit   Vital Signs:  Patient profile:   77 year old female BP sitting:   162 / 70  (left arm) Cuff size:   large  Vitals Entered By: Army Fossa CMA (November 01, 2009 10:24 AM)  Impression & Recommendations:  Problem # 1:  HYPERTENSION (ICD-401.9)  see above, needs a new a BP machine. Her updated medication list for this problem includes:    Furosemide 40 Mg Tabs (Furosemide) .Marland Kitchen... 1 by mouth once daily    Cartia Xt 300 Mg Xr24h-cap (Diltiazem hcl coated beads) .Marland Kitchen... 1 by mouth once daily    Losartan Potassium 50 Mg Tabs (Losartan potassium) .Marland Kitchen... 1 by mouth two times a day.  BP today: 162/70 Prior BP: 182/90 (10/29/2009)  Labs Reviewed: K+: 4.9 (03/11/2009) Creat: : 0.7 (03/11/2009)   Chol: 148 (03/11/2009)   HDL: 50.40 (03/11/2009)   LDL: 73 (03/11/2009)   TG: 124.0 (03/11/2009)  Orders: No Charge Patient Arrived (NCPA0) (NCPA0)  Complete Medication List: 1)  Simvastatin 20 Mg Tabs (Simvastatin) .... Take 1 tablet by mouth once a day 2)  Novolin 70/30 70-30 % Susp (Insulin isophane & regular) .... 25  units in am, 20 units at  pm 3)  Furosemide 40 Mg Tabs (Furosemide) .Marland Kitchen.. 1 by mouth once daily 4)  Cartia Xt 300 Mg Xr24h-cap (Diltiazem hcl coated beads) .Marland Kitchen.. 1 by mouth once daily 5)  Losartan Potassium 50 Mg Tabs (Losartan potassium) .Marland Kitchen.. 1 by mouth two times a day. 6)  Allegra 180 Mg Tabs (Fexofenadine hcl) .Marland Kitchen.. 1 by mouth once daily 7)  Tylenol Arthritis Pain 650 Mg Tbcr (Acetaminophen) .... By mouth every 6-7 hours 8)  Calcium/d 600mg   .... Take 1 tablet by mouth two times a day 9)  Flonase 50 Mcg/act Susp (Fluticasone propionate) .... 2 sprays on each side of the nose once daily 10)  Miralax Pack (Polyethylene glycol 3350) 11)  Ra Col-rite 100 Mg Caps (Docusate sodium) 12)  Aspirin 81 Mg Tbec (Aspirin) .Marland Kitchen.. 1 in the morning with food 13)  Glucose Tablets   CC: Pt needed BP Cuffs compared.    Comments with her machine she got 189/75, ours 162/70- advised pt her BP machine was off, she would need to invest in a new one. Pt agreeded.   Allergies: 1)  ! Pcn  Orders Added: 1)  No Charge Patient Arrived (NCPA0) [NCPA0]

## 2010-02-17 NOTE — Letter (Signed)
Summary: BP  READINGS  BP AND SUGAR READINGS   Imported By: Lavell Islam 11/19/2009 10:00:46  _____________________________________________________________________  External Attachment:    Type:   Image     Comment:   External Document

## 2010-02-17 NOTE — Progress Notes (Signed)
Summary: rx  Phone Note Refill Request Message from:  Patient  Refills Requested: Medication #1:  DIOVAN 160 MG TABS 1 two times a day [facility] t [facility]  Initial call taken by: Kandice Hams,  April 15, 2009 10:50 AM    Prescriptions: DIOVAN 160 MG TABS (VALSARTAN) 1 two times a day  #60 x 2   Entered by:   Kandice Hams   Authorized by:   Nolon Rod. Paz MD   Signed by:   Kandice Hams on 04/15/2009   Method used:   Faxed to ...       [facility]  [address] [id number]* (retail)       [address]       [place], [place]  [address]       Ph: [phone] or [phone]       Fax: [phone]   RxID:   [id number]

## 2010-02-17 NOTE — Assessment & Plan Note (Signed)
Summary: rov//lch   Vital Signs:  Patient profile:   77 year old female Weight:      213.13 pounds BMI:     34.52 Pulse rate:   72 / minute Pulse rhythm:   regular Resp:     16 per minute BP sitting:   138 / 82  (right arm) Cuff size:   regular  Vitals Entered By: Mervin Kung CMA (March 11, 2009 9:18 AM) CC: follow-up visit of disease management Is Patient Diabetic? Yes CBG Result 132 Comments low bs--98  high bs--200   avg bs--140 Please see Cartia strength. Bottle says 240mg  two times a day, chart says 300mg  once daily. BS--132 9:30am   History of Present Illness: had a viral illness, Rx zpack at a UC (02-22-09) "I was really sick" still has  cough w/ thick white sputum still has  sinus  congestion, no chest congestion anymore   Diabetes--   CBGs "up and down"  recently , thinks related to recent illnes self decreased insulin from 34-25 to 30-25  Hypertension--  rarely check ambulatory BPs , last night SBP 130 good medication compliance   Hyperlipidemia -- did increase cholesterol med to 20mg         Preventive Screening-Counseling & Management  Alcohol-Tobacco     Smoking Status: never  Allergies: 1)  ! Pcn  Past History:  Past Medical History: Diabetes mellitus, type II Hypertension Hyperlipidemia  Osteoarthritis Cancer 1974 (Type??) s/p removal of the 3th R rib Cerebrovascular accident, hx of  (x2) carotid u/s and ECHO 11-09 essentially neg, EF 55% 2006 cardiac cath @ [facility] : non critical dz, EF 65% h/o Gilford Silvius (was told not to take  pneumonia-flu shots) Osteoporosis  Past Surgical History: Reviewed history from 03/06/2007 and no changes required. Tonsillectomy 3RD RIB-RIGHT SIDE REMOVED (DUE TO CANCER) Cholecystectomy 7 NATURAL BIRTHS  Social History: Reviewed history from 03/23/2008 and no changes required. Married 7children moved from the [place] area 1952 diet-- "trying to do better" exercise--not very active lately    Review of Systems CV:  Denies chest pain or discomfort and swelling of feet. GI:  Denies nausea and vomiting; had diarrhea while on abx (see HPI). GU:  Denies dysuria and hematuria. Endo:  not eating enough x last few weeks d/t URI-viral ilness, had a single episode of low sugar symptoms but did not get a cbg while she had the symptoms.  Physical Exam  General:  alert and well-developed.   Head:  is symmetric, no tender to palpation in the maxillary area Nose:  slightly congested Lungs:  normal respiratory effort, no intercostal retractions, no accessory muscle use, and normal breath sounds.   Heart:  normal rate, regular rhythm, no murmur, and no gallop.      Impression & Recommendations:  Problem # 1:  VIRAL INFECTION-UNSPEC (ICD-079.99) recovering from a viral infection, remaining symptoms are sinus congestion and cough see instructions Her updated medication list for this problem includes:    Tylenol Arthritis Pain 650 Mg Tbcr (Acetaminophen) ..... By mouth every 6-7 hours  Problem # 2:  HYPERLIPIDEMIA (ICD-272.4) patient did increase simvastatin from 10 to 20 mg since the last office visit.  Labs Her updated medication list for this problem includes:    Simvastatin 20 Mg Tabs (Simvastatin) .Marland Kitchen... Take 1 tablet by mouth once a day    Labs Reviewed: SGOT: 14 (11/20/2008)   SGPT: 13 (11/20/2008)   HDL:40.9 (03/23/2008), 37.9 (07/02/2007)  LDL:119 (03/23/2008), DEL (07/02/2007)  Chol:192 (03/23/2008), 231 (07/02/2007)  Trig:161 (03/23/2008), 167 (07/02/2007)    Orders: Venipuncture (16109) TLB-ALT (SGPT) (84460-ALT) TLB-AST (SGOT) (84450-SGOT) TLB-Lipid Panel (80061-LIPID)  Problem # 3:  HYPERTENSION (ICD-401.9) BP well controlled today,  good medication compliance Her updated medication list for this problem includes:    Furosemide 40 Mg Tabs (Furosemide) .Marland Kitchen... 1 by mouth once daily    Cartia Xt 300 Mg Xr24h-cap (Diltiazem hcl coated beads) .Marland Kitchen... 1 by mouth once  daily    Diovan 160 Mg Tabs (Valsartan) .Marland Kitchen... 1 two times a day    BP today: 138/82 Prior BP: 140/80 (11/20/2008)  Labs Reviewed: K+: 4.6 (07/29/2008) Creat: : 0.8 (07/29/2008)   Chol: 192 (03/23/2008)   HDL: 40.9 (03/23/2008)   LDL: 119 (03/23/2008)   TG: 161 (03/23/2008)  Her updated medication list for this problem includes:    Furosemide 40 Mg Tabs (Furosemide) .Marland Kitchen... 1 by mouth once daily    Cartia Xt 300 Mg Xr24h-cap (Diltiazem hcl coated beads) .Marland Kitchen... 1 by mouth once daily    Diovan 160 Mg Tabs (Valsartan) .Marland Kitchen... 1 two times a day  Orders: TLB-BMP (Basic Metabolic Panel-BMET) (80048-METABOL)  Problem # 4:  DIABETES MELLITUS, TYPE II (ICD-250.00) her diet has been disrupted by recent viral infection, her sugars  have  been up and down, she decrease her insulin from 34/25  to 30/25  had a single episode low blood sugar symptoms labs adjust medication if needed Her updated medication list for this problem includes:    Novolin 70/30 70-30 % Susp (Insulin isophane & regular) .Marland KitchenMarland KitchenMarland KitchenMarland Kitchen 30 in am, 25 at pm    Diovan 160 Mg Tabs (Valsartan) .Marland Kitchen... 1 two times a day  Orders: TLB-A1C / Hgb A1C (Glycohemoglobin) (83036-A1C)  Complete Medication List: 1)  Simvastatin 20 Mg Tabs (Simvastatin) .... Take 1 tablet by mouth once a day 2)  Novolin 70/30 70-30 % Susp (Insulin isophane & regular) .... 30 in am, 25 at pm 3)  Furosemide 40 Mg Tabs (Furosemide) .Marland Kitchen.. 1 by mouth once daily 4)  Aggrenox 25-200 Mg Cp12 (Aspirin-dipyridamole) .Marland Kitchen.. 1 by mouth two times a day 5)  Cartia Xt 300 Mg Xr24h-cap (Diltiazem hcl coated beads) .Marland Kitchen.. 1 by mouth once daily 6)  Allegra 180 Mg Tabs (Fexofenadine hcl) .Marland Kitchen.. 1 by mouth once daily 7)  Tylenol Arthritis Pain 650 Mg Tbcr (Acetaminophen) .... By mouth every 6-7 hours 8)  Calcium/d 600mg   .... Take 1 tablet by mouth two times a day 9)  B12  10)  Diovan 160 Mg Tabs (Valsartan) .Marland Kitchen.. 1 two times a day 11)  Flonase 50 Mcg/act Susp (Fluticasone propionate) .... 2  sprays on each side of the nose once daily  Patient Instructions: 1)  Please schedule a follow-up appointment in 3 months  (yearly checkup) 2)  Flonase on each side of the nose every day 3)  Mucinex DM daily for one week 4)  if the cough and congestion are not better in two weeks, let me know Prescriptions: FLONASE 50 MCG/ACT SUSP (FLUTICASONE PROPIONATE) 2 sprays on each side of the nose once daily  #1 x 3   Entered and Authorized by:   Elita Quick E. Ngina Royer MD   Signed by:   Nolon Rod. Denai Caba MD on 03/11/2009   Method used:   Print then Give to Patient   RxID:   [id number]   Current Allergies (reviewed today): ! PCN   Preventive Care Screening  Last Flu Shot:    Date:  03/11/2009    Results:  declined  Bone Density:  Date:  01/17/2008    Results:  abnormal std dev  Last Pneumovax:    Date:  02/16/1994    Results:  given   Colonoscopy:    Date:  01/16/1994    Results:  normal    Contraindications/Deferment of Procedures/Staging:    Test/Procedure: FLU VAX    Reason for deferment: patient declined

## 2010-02-17 NOTE — Progress Notes (Signed)
Summary: samples  Phone Note Call from Patient   Summary of Call: Pt called and states she is in the donut whole and cannot afford her Diovan would like to know if we have any samples. I gave pt 1 month supply of samples to help her out. Army Fossa CMA  September 27, 2009 1:10 PM

## 2010-02-17 NOTE — Progress Notes (Signed)
Summary: DIOVAN--SUBSTITUTE LOSARTAN?   Phone Note Refill Request Message from:  Fax from Pharmacy on October 21, 2009 12:08 PM  Refills Requested: Medication #1:  DIOVAN 160 MG TABS 1 two times a day   Notes: SEE NOTE [facility], [address] [place], [place]    FAX  [phone]     FAX SHOWS PATIENT NAME AS [name], BUT DOB AND HOME ADDRESS FOR [name] AGREE    ******NOTE---HANDWRITTEN NOTE ON FAX  =  "PATIENT IN COVERAGE GAP.   REQUESTING TO CHANGE RX TO LOSARTAN DUE TO COST.  SHE IS LEAVING TOWN TODAY AND WOULD LIKE SAMPLES OF DIOVAN 160 MG IF POSSIBLE"  Initial call taken by: Jerolyn Shin,  October 21, 2009 12:18 PM  Follow-up for Phone Call        switch to losartan 50 mg b.i.d.,call 60 and 2 refills office visit in 3 weeks. if she won't be able to get losartan soon, then give her some samples of Diovan  Follow-up by: Nolon Rod. Paz MD,  October 22, 2009 11:43 AM  Additional Follow-up for Phone Call Additional follow up Details #1::        LMTCB. Army Fossa CMA  October 22, 2009 11:56 AM   Tried calling pt again-- no answer no voicemail. Army Fossa CMA  October 22, 2009 4:04 PM     Additional Follow-up for Phone Call Additional follow up Details #2::    Tried calling no answer, no voicemail. Army Fossa CMA  October 25, 2009 1:38 PM   Tried calling pt- no answer no voicemial .Army Fossa CMA  October 26, 2009 11:27 AM   Additional Follow-up for Phone Call Additional follow up Details #3:: Details for Additional Follow-up Action Taken: Pt is aware- med sent in. Army Fossa CMA  October 26, 2009 4:15 PM   New/Updated Medications: LOSARTAN POTASSIUM 50 MG TABS (LOSARTAN POTASSIUM) 1 by mouth two times a day. Prescriptions: LOSARTAN POTASSIUM 50 MG TABS (LOSARTAN POTASSIUM) 1 by mouth two times a day.  #60 x 2   Entered by:   Army Fossa CMA   Authorized by:   Nolon Rod. Paz MD   Signed by:   Army Fossa CMA on 10/26/2009   Method  used:   Electronically to        [facility]* (retail)       [address]       [address]       [place], [place]  [address]       Ph: [phone]       Fax: [phone]   RxID:   [id number]

## 2010-02-17 NOTE — Progress Notes (Signed)
  Phone Note Outgoing Call   Summary of Call: discharge summary from the hospital in [place] reviewed She was seen with a  GI bleed, they are holding her Aggrenox. Plan: Contact patient, advised her to hold  Aggrenox until she sees me. (We may need to continue with aspirin only) schedule a followup for next week Miriam Liles E. Irisa Grimsley MD  September 06, 2009 3:34 PM    Follow-up for Phone Call        I spoke with pt and she has an appt on Thursday, will continue to hold her Aggrenox. Army Fossa CMA  September 06, 2009 3:48 PM

## 2010-02-17 NOTE — Progress Notes (Signed)
  Phone Note Call from Patient   Summary of Call: pt wanted shingles vaccine rx given Initial call taken by: Kandice Hams,  March 22, 2009 2:18 PM    New/Updated Medications: ZOSTAVAX 16109 UNT/0.65ML SOLR (ZOSTER VACCINE LIVE) use as directed Prescriptions: ZOSTAVAX 60454 UNT/0.65ML SOLR (ZOSTER VACCINE LIVE) use as directed  #1 x 0   Entered by:   Kandice Hams   Authorized by:   Nolon Rod. Paz MD   Signed by:   Kandice Hams on 03/22/2009   Method used:   Print then Give to Patient   RxID:   [id number]

## 2010-02-17 NOTE — Miscellaneous (Signed)
Summary: shingles vaccine   Immunization History:  Zostavax History:    Zostavax # 1:  zostavax (03/23/2009)

## 2010-02-18 ENCOUNTER — Telehealth (INDEPENDENT_AMBULATORY_CARE_PROVIDER_SITE_OTHER): Payer: Self-pay | Admitting: *Deleted

## 2010-02-23 NOTE — Assessment & Plan Note (Signed)
Summary: 3 month f/u   Vital Signs:  Patient profile:   77 year old female Height:      66 inches Weight:      213.38 pounds BMI:     34.56 Pulse rate:   84 / minute Pulse rhythm:   regular BP sitting:   156 / 82  (left arm) Cuff size:   large  Vitals Entered By: Army Fossa CMA (February 16, 2010 8:25 AM) CC: 3 month f/u- fasting  Comments could not take Clonidine "states it is not approved by the FDA" [facility]    History of Present Illness: ROV  Diabetes -ambulatory CBGs slightly  high lately , usually  ~ 140 morning and afternoon. On insulin 25 u two times a day  Hypertension-- ambulatory BPs betwen 140-159 when checked  , was recommended clonidine but decided not to take it because  "is not approved by the FDA" Hyperlipidemia -- good medication compliance , no apparent side effects had a "sore" in the side of the R leg, looks better, no d/c     Current Medications (verified): 1)  Simvastatin 20 Mg Tabs (Simvastatin) .... Take 1 Tablet By Mouth Once A Day 2)  Novolin 70/30 70-30 %  Susp (Insulin Isophane & Regular) .... 25  Units in Am, 20 Units At  Pm 3)  Furosemide 40 Mg  Tabs (Furosemide) .Marland Kitchen.. 1 By Mouth Once Daily 4)  Cartia Xt 300 Mg Xr24h-Cap (Diltiazem Hcl Coated Beads) .Marland Kitchen.. 1 By Mouth Once Daily 5)  Losartan Potassium 50 Mg Tabs (Losartan Potassium) .Marland Kitchen.. 1 By Mouth Two Times A Day. 6)  Allegra 180 Mg  Tabs (Fexofenadine Hcl) .Marland Kitchen.. 1 By Mouth Once Daily 7)  Tylenol Arthritis Pain 650 Mg  Tbcr (Acetaminophen) .... By Mouth Every 6-7 Hours 8)  Calcium/d 600mg  .... Take 1 Tablet By Mouth Two Times A Day 9)  Flonase 50 Mcg/act Susp (Fluticasone Propionate) .... 2 Sprays On Each Side of The Nose Once Daily 10)  Miralax  Pack (Polyethylene Glycol 3350) 11)  Ra Col-Rite 100 Mg Caps (Docusate Sodium) 12)  Aspirin 81 Mg Tbec (Aspirin) .Marland Kitchen.. 1 in The Morning With Food 13)  Glucose Tablets  Allergies (verified): 1)  ! Pcn  Past History:  Past Medical  History: Reviewed history from 10/29/2009 and no changes required. Diabetes mellitus, type II Hypertension Hyperlipidemia  Osteoarthritis Cancer 1974 (Type??) s/p removal of the 3th R rib Cerebrovascular accident, hx of  (x2) carotid u/s and ECHO 11-09 essentially neg, EF 55% 2006 cardiac cath @ [facility] : non critical dz, EF 65% h/o Gilford Silvius (was told not to take  pneumonia-flu shots) Osteoporosis GI bleed 08-2009, admited to H.P.  Past Surgical History: Reviewed history from 03/06/2007 and no changes required. Tonsillectomy 3RD RIB-RIGHT SIDE REMOVED (DUE TO CANCER) Cholecystectomy 7 NATURAL BIRTHS  Social History: Married 7children moved from the [place] area 1952 diet--room for improvement exercise--not very active lately   Review of Systems CV:  Denies chest pain or discomfort and swelling of feet. Resp:  Denies cough and shortness of breath. GI:  Denies bloody stools, diarrhea, nausea, and vomiting. GU:  reports that the urine has "strong odor" no dysuria or gross hematuria per se. . Endo:  Denies weight change; no low sugar type of symptoms .  Physical Exam  General:  she is noted to be alert, oriented x3 , in no apparent distress Lungs:  normal respiratory effort, no intercostal retractions, no accessory muscle use, and normal  breath sounds.   Heart:  normal rate, regular rhythm, no murmur, and no gallop.   Extremities:  no  edema at the distal lower extremities has a superficial, 3 mm healing wound at the lateral side of the right leg, no cellulitis   Impression & Recommendations:  Problem # 1:  DIABETES MELLITUS, TYPE II (ICD-250.00) room for improvement on her diet....healthy diet discussed, information provided She sees her eye doctor regularly She is currently taking 25 units of insulin twice a day we'll check A1c, adjust insulin if needed Her updated medication list for this problem includes:    Novolin 70/30 70-30 % Susp (Insulin isophane & regular)  .Marland Kitchen... 25  units in am, 25 units at  pm    Losartan Potassium 50 Mg Tabs (Losartan potassium) .Marland Kitchen... 1 by mouth two times a day.    Aspirin 81 Mg Tbec (Aspirin) .Marland Kitchen... 1 in the morning with food  Orders: TLB-A1C / Hgb A1C (Glycohemoglobin) (83036-A1C) Specimen Handling (04540)  Problem # 2:  HYPERTENSION (ICD-401.9)  not well controlled, decided not to take clonidine because  "is not approved by the FDA" explained benefits of clonodine and  risk of elevated BP encouraged to try (she filled the Rx but has not try yet)  Her updated medication list for this problem includes:    Furosemide 40 Mg Tabs (Furosemide) .Marland Kitchen... 1 by mouth once daily    Cartia Xt 300 Mg Xr24h-cap (Diltiazem hcl coated beads) .Marland Kitchen... 1 by mouth once daily    Losartan Potassium 50 Mg Tabs (Losartan potassium) .Marland Kitchen... 1 by mouth two times a day.    Clonidine Hcl 0.2 Mg Tabs (Clonidine hcl) .Marland Kitchen... 1 by mouth two times a day.  Orders: Venipuncture (98119) TLB-BMP (Basic Metabolic Panel-BMET) (80048-METABOL) Specimen Handling (14782)  Problem # 3:  HYPERLIPIDEMIA (ICD-272.4) labs Her updated medication list for this problem includes:    Simvastatin 20 Mg Tabs (Simvastatin) .Marland Kitchen... Take 1 tablet by mouth once a day  Labs Reviewed: SGOT: 17 (03/11/2009)   SGPT: 15 (03/11/2009)   HDL:50.40 (03/11/2009), 40.9 (03/23/2008)  LDL:73 (03/11/2009), 119 (95/62/1308)  Chol:148 (03/11/2009), 192 (03/23/2008)  Trig:124.0 (03/11/2009), 161 (03/23/2008)  Orders: TLB-Lipid Panel (80061-LIPID)  Problem # 4:  OTHER ABNORMALITY OF URINATION (ICD-788.69) "Strong odor in the urine", urinalysis negative. Orders: UA Dipstick w/o Micro (automated)  (81003)  Complete Medication List: 1)  Simvastatin 20 Mg Tabs (Simvastatin) .... Take 1 tablet by mouth once a day 2)  Novolin 70/30 70-30 % Susp (Insulin isophane & regular) .... 25  units in am, 25 units at  pm 3)  Furosemide 40 Mg Tabs (Furosemide) .Marland Kitchen.. 1 by mouth once daily 4)  Cartia Xt 300  Mg Xr24h-cap (Diltiazem hcl coated beads) .Marland Kitchen.. 1 by mouth once daily 5)  Losartan Potassium 50 Mg Tabs (Losartan potassium) .Marland Kitchen.. 1 by mouth two times a day. 6)  Allegra 180 Mg Tabs (Fexofenadine hcl) .Marland Kitchen.. 1 by mouth once daily 7)  Tylenol Arthritis Pain 650 Mg Tbcr (Acetaminophen) .... By mouth every 6-7 hours 8)  Calcium/d 600mg   .... Take 1 tablet by mouth two times a day 9)  Flonase 50 Mcg/act Susp (Fluticasone propionate) .... 2 sprays on each side of the nose once daily 10)  Miralax Pack (Polyethylene glycol 3350) 11)  Ra Col-rite 100 Mg Caps (Docusate sodium) 12)  Aspirin 81 Mg Tbec (Aspirin) .Marland Kitchen.. 1 in the morning with food 13)  Glucose Tablets  14)  Clonidine Hcl 0.2 Mg Tabs (Clonidine hcl) .Marland Kitchen.. 1 by mouth  two times a day.  Patient Instructions: 1)  I recommend to try clonodine twice a day 2)  call with the blood pressure readings in 2 weeks 3)  if you decide not to try,let me know  4)  Please schedule a follow-up appointment in 3 months .    Orders Added: 1)  Venipuncture [36415] 2)  TLB-BMP (Basic Metabolic Panel-BMET) [80048-METABOL] 3)  TLB-A1C / Hgb A1C (Glycohemoglobin) [83036-A1C] 4)  TLB-Lipid Panel [80061-LIPID] 5)  Specimen Handling [99000] 6)  UA Dipstick w/o Micro (automated)  [81003] 7)  Est. Patient Level IV [81191]    Laboratory Results   Urine Tests    Routine Urinalysis   Color: yellow Appearance: Clear Glucose: negative   (Normal Range: Negative) Bilirubin: negative   (Normal Range: Negative) Ketone: negative   (Normal Range: Negative) Spec. Gravity: 1.015   (Normal Range: 1.003-1.035) Blood: negative   (Normal Range: Negative) pH: 6.5   (Normal Range: 5.0-8.0) Protein: negative   (Normal Range: Negative) Urobilinogen: 0.2   (Normal Range: 0-1) Nitrite: negative   (Normal Range: Negative) Leukocyte Esterace: negative   (Normal Range: Negative)    Comments: Army Fossa CMA  February 16, 2010 9:44 AM

## 2010-02-23 NOTE — Progress Notes (Signed)
Summary: reaction to clonidine  Phone Note Call from Patient Call back at Home Phone [phone]   Caller: Patient Summary of Call: Spoke w/ patient started taking clonidine as directed from office visit on 02/16/10 took medication for 2 days and started having the shakes and felt like she was gasping for breath.  She was very fatigue but hasn't taken medication today and feels much better would like to know if she could just take medication once daily b/c was fine until she had to take second pill or if medication is change she needs to have generic.  Pls advise.  Initial call taken by: Doristine Devoid CMA,  February 18, 2010 3:22 PM  Follow-up for Phone Call        okay to try once daily either at  day or at night ,  whenever she feels is better. To let us know how her blood pressure is Follow-up by: Jose E. Paz MD,  February 18, 2010 4:30 PM  Additional Follow-up for Phone Call Additional follow up Details #1::        spoke w/ patient aware of instructions and will call to let us know if she has any problems.........Marland KitchenDoristine Devoid CMA  February 18, 2010 4:35 PM

## 2010-02-25 ENCOUNTER — Telehealth (INDEPENDENT_AMBULATORY_CARE_PROVIDER_SITE_OTHER): Payer: Self-pay | Admitting: *Deleted

## 2010-03-03 NOTE — Progress Notes (Signed)
Summary: Diltiazem, furosemide, losartan refills  Phone Note Refill Request Message from:  Fax from Pharmacy on February 25, 2010 9:32 AM  Refills Requested: Medication #1:  CARTIA XT 300 MG XR24H-CAP 1 by mouth once daily   Notes: qty = 30  Medication #2:  FUROSEMIDE 40 MG  TABS 1 by mouth once daily   Notes: qty = 30  Medication #3:  LOSARTAN POTASSIUM 50 MG TABS 1 by mouth two times a day.   Notes: qty = [address] [facility], [address], [address], [place], [place]  [address]   phone - [phone]    fax - [phone]    Next Appointment Scheduled: wed 5/2   paz Initial call taken by: Jerolyn Shin,  February 25, 2010 9:42 AM    Prescriptions: FUROSEMIDE 40 MG  TABS (FUROSEMIDE) 1 by mouth once daily  #30 Tablet x 2   Entered by:   Army Fossa CMA   Authorized by:   Nolon Rod. Paz MD   Signed by:   Army Fossa CMA on 02/25/2010   Method used:   Faxed to ...       [facility] (retail)       [address]       [address]       [place], [place]  [address]  [place]       Ph: [phone]       Fax: [phone]   RxID:   [id number] LOSARTAN POTASSIUM 50 MG TABS (LOSARTAN POTASSIUM) 1 by mouth two times a day.  #60 x 2   Entered by:   Army Fossa CMA   Authorized by:   Nolon Rod. Paz MD   Signed by:   Army Fossa CMA on 02/25/2010   Method used:   Faxed to ...       [facility] (retail)       [address]       [address]       [place], [place]  [address]  [place]       Ph: [phone]       Fax: [phone]   RxID:   [id number] CARTIA XT 300 MG XR24H-CAP (DILTIAZEM HCL COATED BEADS) 1 by mouth once daily  #30 Capsule x 2   Entered by:   Army Fossa CMA   Authorized by:   Nolon Rod. Paz MD   Signed by:   Army Fossa CMA on 02/25/2010   Method used:   Faxed to ...       [facility] (retail)       [address]       [address]       [place], [place]  [address]  [place]       Ph: [phone]       Fax: [phone]   RxID:   [id number]

## 2010-03-25 ENCOUNTER — Telehealth (INDEPENDENT_AMBULATORY_CARE_PROVIDER_SITE_OTHER): Payer: Self-pay | Admitting: *Deleted

## 2010-03-26 ENCOUNTER — Encounter: Payer: Self-pay | Admitting: Internal Medicine

## 2010-03-29 NOTE — Progress Notes (Signed)
Summary: Fluticasone refill  Phone Note Refill Request Message from:  Fax from Pharmacy on March 25, 2010 10:39 AM  Refills Requested: Medication #1:  FLONASE 50 MCG/ACT SUSP 2 sprays on each side of the nose once daily [facility], [address], [place], [place]  [address]   phone - [phone]    fax - [phone]    Next Appointment Scheduled: Wed 5/2    paz Initial call taken by: Jerolyn Shin,  March 25, 2010 11:14 AM    Prescriptions: FLONASE 50 MCG/ACT SUSP (FLUTICASONE PROPIONATE) 2 sprays on each side of the nose once daily  #1 x 3   Entered by:   Army Fossa CMA   Authorized by:   Nolon Rod. Paz MD   Signed by:   Army Fossa CMA on 03/25/2010   Method used:   Electronically to        [id number] (retail)       [address]       [place], [place]  [address]       Ph: [phone] or [phone]       Fax: [phone]   RxID:   [id number]

## 2010-04-26 ENCOUNTER — Other Ambulatory Visit: Payer: Self-pay | Admitting: *Deleted

## 2010-04-26 MED ORDER — FLUTICASONE PROPIONATE 50 MCG/ACT NA SUSP: 2.0000 | Freq: Every day | NASAL | Status: AC

## 2010-05-02 ENCOUNTER — Other Ambulatory Visit: Payer: Self-pay | Admitting: *Deleted

## 2010-05-02 MED ORDER — SIMVASTATIN 20 MG PO TABS: 20.0000 mg | ORAL_TABLET | Freq: Every day | ORAL | Status: DC

## 2010-05-18 ENCOUNTER — Ambulatory Visit (INDEPENDENT_AMBULATORY_CARE_PROVIDER_SITE_OTHER): Payer: Medicare Other | Admitting: Internal Medicine

## 2010-05-18 DIAGNOSIS — E785 Hyperlipidemia, unspecified: Secondary | ICD-10-CM

## 2010-05-18 DIAGNOSIS — E119 Type 2 diabetes mellitus without complications: Secondary | ICD-10-CM

## 2010-05-18 DIAGNOSIS — M1990 Unspecified osteoarthritis, unspecified site: Secondary | ICD-10-CM

## 2010-05-18 DIAGNOSIS — I10 Essential (primary) hypertension: Secondary | ICD-10-CM

## 2010-05-18 LAB — CBC WITH DIFFERENTIAL/PLATELET
Basophils Relative: 0.5 % (ref 0.0–3.0)
Eosinophils Relative: 1.4 % (ref 0.0–5.0)
Hemoglobin: 14.4 g/dL (ref 12.0–15.0)
Lymphocytes Relative: 32.8 % (ref 12.0–46.0)
MCV: 87.3 fl (ref 78.0–100.0)
Neutro Abs: 3.7 10*3/uL (ref 1.4–7.7)
Neutrophils Relative %: 59.4 % (ref 43.0–77.0)
RBC: 4.86 Mil/uL (ref 3.87–5.11)
WBC: 6.2 10*3/uL (ref 4.5–10.5)

## 2010-05-18 MED ORDER — HYDROCODONE-ACETAMINOPHEN 2.5-500 MG PO TABS: 1.0000 | ORAL_TABLET | Freq: Four times a day (QID) | ORAL | Status: AC | PRN

## 2010-05-18 NOTE — Assessment & Plan Note (Signed)
Unwilling to take clonodine. Marginal control. No change.

## 2010-05-18 NOTE — Progress Notes (Signed)
  Subjective:    Patient ID: [name], female    DOB: Sep 19, 1926, 77 y.o.   MRN: [id number]  HPI  routine office visit Diabetes, was recommended to increase her insulin to 30 units twice a day. She is only doing 30 in the morning and 25 in the afternoon. I asked her about her sugars "I can't tell you but they are not as high as before".  High cholesterol, based on the last profile we recommended change to simvastatin and Lipitor. "I will not take Lipitor, it is a statin". I told her that simvastatin is also starting but she is willing to take it.  Hypertension, I recommended to try clonidine. "You know I am not going to take clonodine". When asked about her ambulatory blood pressures she said "I have a log, I didn't bring it, I think is okay"  Also she has a long history of DJD, currently having a lot of pain from the knees down. Pain is worse with any type of movement. She has been taking Tylenol regularly but she is afraid of liver damage. see review of systems  Past Medical History  Diagnosis Date  . Osteoarthritis   . Cancer 1974    type? s/p removal of the 3rd R rib  . Cerebrovascular accident     hx of x 2  . History of cardiac cath 2006    @ [facility]: non critical dz, Ef 65%  . Osteoporosis   . GI bleed 08/2009    admitted to H.P.   Past Surgical History  Procedure Date  . Tonsillectomy   . Rib removed     3rd Right rib, due to cancer  . Cholecystectomy   . Natural birth     9     Review of Systems No chest pain or shortness of breath No nausea, vomiting, diarrhea. Had some problems with allergies but they are well controlled with Allegra. Denies any fever, headaches or weight loss.     Objective:   Physical Exam  Constitutional: She appears well-developed and well-nourished.  Cardiovascular: Normal rate and normal heart sounds.   No murmur heard.      Normal bilateral febrile and pedal pulses.    Pulmonary/Chest: Effort normal and breath sounds normal. No  respiratory distress. She has no wheezes. She has no rales.  Musculoskeletal:       No lower extremity edema          Assessment & Plan:

## 2010-05-18 NOTE — Assessment & Plan Note (Signed)
Not at goal, patient not willing to take Lipitor. Unfortunately we'll have to stay in the present regimen

## 2010-05-18 NOTE — Assessment & Plan Note (Addendum)
Low history of arthritis, current complaint is pain from the knees down. Physical exam show acceptable peripheral pulses. She's afraid of taking Tylenol long-term. I agreed to prescribe a low dose of hydrocodone.Side effects discuss (somnolence). Declined a  orthopedic surgical referral.

## 2010-05-18 NOTE — Assessment & Plan Note (Signed)
Not under ideal control, treatment limited by poor compliance.

## 2010-05-19 ENCOUNTER — Telehealth: Payer: Self-pay | Admitting: *Deleted

## 2010-05-19 NOTE — Telephone Encounter (Signed)
I spoke w/ pt she declines endo referral. Will take Novolin 30 units bid.

## 2010-05-19 NOTE — Telephone Encounter (Signed)
Message copied by Army Fossa on Thu May 19, 2010  1:11 PM ------      Message from: Willow Ora      Created: Thu May 19, 2010 12:39 PM       Diabetes needs better control, I recommend a  endocrinology referral.       Please refer her if she is willing to go. If she's not, increase novolin 70/30 to 30 units twice a day

## 2010-05-26 ENCOUNTER — Other Ambulatory Visit: Payer: Self-pay | Admitting: *Deleted

## 2010-05-26 MED ORDER — DILTIAZEM HCL ER COATED BEADS 300 MG PO CP24: 300.0000 mg | ORAL_CAPSULE | Freq: Every day | ORAL | Status: DC

## 2010-05-26 MED ORDER — LOSARTAN POTASSIUM 50 MG PO TABS: 50.0000 mg | ORAL_TABLET | Freq: Two times a day (BID) | ORAL | Status: DC

## 2010-05-26 MED ORDER — FUROSEMIDE 40 MG PO TABS: 40.0000 mg | ORAL_TABLET | Freq: Every day | ORAL | Status: DC

## 2010-05-26 MED ORDER — SIMVASTATIN 20 MG PO TABS: 20.0000 mg | ORAL_TABLET | Freq: Every day | ORAL | Status: DC

## 2010-05-31 NOTE — H&P (Signed)
[name], [name]               ACCOUNT NO.:  [id number]   MEDICAL RECORD NO.:  [id number]          PATIENT TYPE:  OBV   LOCATION:  1445                         FACILITY:  [facility]   PHYSICIAN:  Darryl D. Prime, MD    DATE OF BIRTH:  [birth year]   DATE OF ADMISSION:  11/16/2007  DATE OF DISCHARGE:                              HISTORY & PHYSICAL   The patient is full code.   PRIMARY CARE PHYSICIAN:  Willow Ora, MD of [facility].  The  patient's total visit time was approximately 60 minutes.  The patient  was the historian as well as her husband.  They were good historians.   CHIEF COMPLAINT:  Dizziness.   HISTORY OF PRESENT ILLNESS:  Ms. Kreischer is a 77 year old female with a  history of possible right-sided hemispheric TIA, history of diabetes,  history of hyperlipidemia, history of atrial tachycardia, history of  diastolic heart failure with echoes showing EF in 2006 in the range of  55-65%, history of Guillain Barre syndrome in 1987, history of  hypertension, who notes dizziness.  The patient has a history of vertigo  but has not had any recently.  She notes while watching TV tonight  trying to get up she has significant dizziness where her feet felt weak  and the room was spinning around her.  The patient sat back down.  She  took a few Dramamine which did not help and noted feeling weak all over.  She denies any focal weakness.  She denies any visual changes.  She  denies any numbness.  She denies any problem with speech or mentation.  The patient denies taking any new medications.  She denies any decrease  in appetite.  Symptoms lasted between 3 p.m. and 8 p.m. and improved  after being given meclizine in the emergency room.   PAST MEDICAL HISTORY:  As above.  She has a history of a cath in June of  2006 that showed 50% mid LAD lesion, otherwise nonobstructive coronary  artery disease.   PAST SURGICAL HISTORY:  As above.   ALLERGIES:  She is allergic to  PENICILLIN.   MEDICATIONS:  1. She is on Lasix 20 mg daily.  2. She is on Aggrenox 1 tablet twice a day.  3. She is on Cardizem 240 mg twice a day.  4. She is on benazepril 20 mg daily.  5. Vytorin 10/10 daily.  6. She is on Novolin 28 units in the morning and 25 units in the p.m.      She notes her blood sugars are usually in the range of 200.  7. She is on Allegra p.r.n.   SOCIAL HISTORY:  No tobacco, alcohol or drug use.   FAMILY HISTORY:  Positive for stroke in the mother at the age of 26.   REVIEW OF SYSTEMS:  A 14 point review of systems was negative unless  stated above.   PHYSICAL EXAMINATION:  VITAL SIGNS:  Temperature 96.7 with a blood  pressure of 183/62, pulse of 69, respiratory rate of 20, sats 96% on  room air.  GENERAL:  The patient is a female who looks much younger than her stated  age lying in bed in no acute distress.  HEENT:  Normocephalic, atraumatic.  Pupils are equal, round and reactive  to light with extraocular movements being intact.  The oropharynx is  dry.  NECK:  Supple with no lymphadenopathy or thyromegaly.  No carotid  bruits.  LUNGS:  Clear to auscultation bilaterally.  CARDIOVASCULAR:  Regular rhythm and rate with no murmurs, rubs or  gallops.  Normal S1, S2, no S3 or S4.  EXTREMITIES:  Show no clubbing or cyanosis.  She has trace lower  extremity edema.  NEUROLOGICAL:  She is alert and oriented x4.  Cranial nerves II-XII  intact.  Strength and sensation grossly intact.  ABDOMEN:  Soft, nontender, nondistended with no hepatosplenomegaly.   LABORATORY DATA:  Shows a sodium of 140 with a potassium of 4.3,  chloride 108, bicarb 25, BUN 12 with a creatinine 0.63, glucose 214,  normal LFTs.  INR 1.0 with a PTT of 27, glucose is 214.  White count is  6.6 with a hemoglobin of 13.9, hematocrit 41.3, platelets 216, segs of  73, lymphocytes 21.  The patient's cardiac markers at 2235 hours on  November 16, 2007, were negative.  CT of the head was  negative for any  acute disease.  There were some microvascular changes in the white  matter which were chronic.  Chest x-ray showed mild cardiomegaly with  possible changes of COPD.  EKG showed normal sinus rhythm with rate of  51 beats per minute, cannot exclude an anterior infarct due to poor  anterior forces.  There is a slight decrease in the anterior forces  compared to EKG on 11/06.  This could be due to lead placement.  She has  a first degree AV block.   ASSESSMENT AND PLAN:  This is a patient with history of vertigo, concern  in the emergency room was for a possible transient ischemic attack.  The  patient denies any focal weakness.  Her symptoms improved with  meclizine.  She will be admitted for observation and will check a  urinalysis.  Will hold off on getting an MRI/MRA pending orthostatics  which we will check.  She will be given gentle hydration to control her  blood pressure.  She will be on telemetry.      Darryl D. Prime, MD  Electronically Signed     DDP/MEDQ  D:  11/17/2007  T:  11/17/2007  Job:  [id number]

## 2010-06-03 NOTE — Op Note (Signed)
[facility]  Patient:    [name], [name]                               Visit Number: [id number] MRN: [id number]          Service Type: DSU Location: DAY Attending:  Trisha Mangle Proc. Date: 08/31/00 Adm. Date:  [id number]                             Operative Report  PREOPERATIVE DIAGNOSES:  Cystocele and stress urinary incontinence.  POSTOPERATIVE DIAGNOSES:  Cystocele and stress urinary incontinence.  PROCEDURE:  Anterior repair and TVT.  SURGEON:  Mark C. Vernie Ammons, M.D.  ANESTHESIA:  General.  DRAINS:  None.  ESTIMATED BLOOD LOSS:  Approximately 20 cc.  SPECIMENS:  None.  COMPLICATIONS:  None.  INDICATIONS:  The patient is a 77 year old white female, who I have seen for some time and has developed progressively worsening cystocele.  She then developed dribbling and leaking with sneeze and cough.  This has also worsened.  A straining cystogram revealed smooth-walled bladder with significant decensus.  She had a grade 2 cystocele on her most recent exam with loss of ureterovesical junction support with cough and Valsalva.  She understands the risks, complications, alternatives, and limitations to surgery and has elected to proceed.  DESCRIPTION OF OPERATION:  After informed consent, the patient was brought to the major OR and placed on the table and administered general anesthesia and then placed in the dorsal lithotomy position.  Her lower abdomen and genitalia as well as vagina was sterilely prepped and draped, and a Foley catheter was placed into the bladder, and the bladder was then drained.  Lidocaine 1% with epinephrine was used to infiltrate the subvaginal mucosa in the midline over the area of the cystocele.  The vaginal speculum was placed in the vagina and incised in the midline over the cystocele, and Allis clamps were placed on the vaginal mucosal edges.  Sharp and blunt dissection was then used to dissect the vaginal  mucosa away from the cystocele back to the lateral fascia which was clearly identified.  Then 2-0 Vicryl suture was used in an interrupted fashion to reapproximate the lateral fascia and obliterate the cystocele.  The redundant vaginal mucosa was then excised and the area copiously irrigated with antibiotic solution.  It was then closed with a running 3-0 Vicryl suture.  Attention was then directed to the area of the mid urethra.  This was also infiltrated with lidocaine with epinephrine, and I then incised over this area and dissected laterally, exposing the mid urethral region that was felt by palpation of the bladder neck and Foley balloon.  Attention was then directed to the suprapubic region and 3 fingerbreadths width in the midline, two stab incisions were made just above the symphysis pubis.  I then placed a catheter guide in the catheter, moved this to the ipsilateral side that I was passing the abdominal needle through and passed this through the suprapubic incision, through the rectus fascia and then back in the retropubic space of Retzius just behind the pubic bone and then out at mid urethral level.  The catheter was removed, and the cystoscope with 70 degree lens was inserted into the bladder, and the bladder was noted to be free of any tumor, stones, or inflammatory lesions.  Also, the needle had passed outside of  the bladder, and there was no evidence of foreign body or bladder perforation.  This was performed in an identical fashion on the contralateral side, reevaluated cystoscopically, and again noted no bladder perforation.  I then drained the bladder, left the Foley catheter in place and placed a 24 French sound beneath the tape, pulling it gently up against the sound.  The sheaths on the tape were removed, and the tape was cut flush with the abdominal skin.  The abdominal incisions were then irrigated with antibiotic solution and closed with Dermabond.  I then closed  the vaginal incision with a running 3-0 Vicryl suture, and 2 inch Iodoform gauze vaginal packing was then placed within the vagina.  The patient tolerated the procedure well.  There were no intraoperative complications.  Sponge, needle, and instrument counts were correct at the end of the operation.  She will be given a prescription for Tylox 1-2 q.4h. p.r.n. #38, and follow up in my office in one week for a recheck. DD:  08/31/00 TD:  08/31/00 Job: [id number]

## 2010-06-03 NOTE — Op Note (Signed)
NAMEEUNIQUE, BALIK NO.:  [id number]   MEDICAL RECORD NO.:  [id number]          PATIENT TYPE:  AMB   LOCATION:  ENDO                         FACILITY:  [facility]   PHYSICIAN:  Graylin Shiver, M.D.   DATE OF BIRTH:  [birth year]   DATE OF PROCEDURE:  12/22/2003  DATE OF DISCHARGE:                                 OPERATIVE REPORT   PROCEDURE:  Colonoscopy with biopsy.   INDICATIONS FOR PROCEDURE:  Screening.  Informed consent was obtained after  explanation of the risks of bleeding, infection, and perforation.   PREMEDICATION:  Fentanyl 70 mcg IV, Versed 7 mg IV.   PROCEDURE IN DETAIL:  With the patient in the left lateral decubitus  position, a rectal exam was performed and no masses were felt.  The Olympus  colonoscope was inserted into the rectum and advanced around the colon to  the cecum.  Cecal landmarks were identified.  The cecum  looked normal.  The  ascending colon revealed a small polyp that was biopsied off with cold  forceps.  The transverse colon looked normal.  In the descending colon,  there was a small polyp biopsied off with cold forceps.  There were  scattered diverticula noted in the left colon.  The rectum looked normal.  She tolerated the procedure well without complications.   IMPRESSION:  1.  Scattered diverticulosis.  2.  Two small polyps as describe above which were biopsied off with cold      forceps.   PLAN:  The pathology will be checked.       SFG/MEDQ  D:  12/22/2003  T:  12/22/2003  Job:  [id number]   cc:   Donia Guiles, M.D.  [address] [place]  [place] [address]  Fax: [phone]

## 2010-06-03 NOTE — Cardiovascular Report (Signed)
NAMEHERAN, CAMPAU NO.:  [id number]   MEDICAL RECORD NO.:  [id number]          PATIENT TYPE:  OIB   LOCATION:  2899                         FACILITY:  [facility]   PHYSICIAN:  Meade Maw, M.D.    DATE OF BIRTH:  Oct 01, 1926   DATE OF PROCEDURE:  DATE OF DISCHARGE:                              CARDIAC CATHETERIZATION   REFERRING PHYSICIAN:  Donia Guiles, M.D.   INDICATIONS FOR PROCEDURE:  A 77 year old female with ongoing dyspnea which  has been progressive in nature, adenosine Cardiolite revealing equivocal  disease in the distal anterior wall.   PROCEDURE:  After obtaining written informed consent, the patient was  brought to the cardiac catheterization lab in the postabsorptive state.  Preoperative sedation was achieved using 1% Xylocaine.  A 6-French  hemostasis sheath was placed into the right femoral artery using a modified  Seldinger's technique. Selective coronary angiography was performed using a  JL-4 and JR-4 Judkins catheter. Multiple views were obtained. All catheter  exchanges were made over a guidewire. Single-plane ventriculogram was  performed in the RAO position using a 6-French pigtail curved catheter.  Multiple views were obtained. The hemostasis sheath was flushed following  each catheter exchange. There was no critical coronary artery disease  identified.  The films will be reviewed with Dr. Corliss Marcus for further  evaluation of the noncritical proximal LAD disease.  The patient was  transferred to the holding area. Hemostasis sheath was removed. Hemostasis  was achieved using digital pressure.   FINDINGS:  The aortic pressure was 156/63.  LV pressure was 157/4.  EDP was  16. Single plane ventriculogram revealed normal wall motion, ejection  fraction 65%.  No significant mitral regurgitation noted.   CORONARY ANGIOGRAPHY:  1.  The left main coronary artery bifurcates into the left anterior      descending and circumflex vessels.  There is no significant disease noted      in the left main coronary artery.  2.  Left anterior descending:  Left anterior descending gives rise to a      large bifurcating D1, goes on to end as an apical branch. There is a 40-      50% proximal lesion noted in the left anterior descending.  3.  Circumflex vessel:  Circumflex vessel is nondominant, gives rise to      trivial OM1, goes on to end as a posterior lateral branch. There is no      significant disease noted in the circumflex or its branches.  4.  Right coronary artery:  Right coronary artery is large dominant artery      for the posterior circulation, gives rise to a very large PL branch.      There is noncritical disease of 30-40% noted in the right coronary      artery.   FINAL IMPRESSION:  1.  Noncritical proximal left anterior descending disease.  2.  Normal single-plane ventriculogram, ejection fraction of 65%.  3.  Normal left ventricular end-diastolic pressure.     HP/MEDQ  D:  06/23/2004  T:  06/23/2004  Job:  [id number]

## 2010-06-03 NOTE — Discharge Summary (Signed)
[name], [name]               ACCOUNT NO.:  [id number]   MEDICAL RECORD NO.:  [id number]          PATIENT TYPE:  INP   LOCATION:  3030                         FACILITY:  [facility]   PHYSICIAN:  Pramod P. Pearlean Brownie, MD    DATE OF BIRTH:  12-20-1926   DATE OF ADMISSION:  12/01/2004  DATE OF DISCHARGE:  12/02/2004                                 DISCHARGE SUMMARY   DISCHARGE DIAGNOSES:  1.  Right hemispheric transient ischemic attack.  2.  Diabetes.  3.  Hyperlipidemia.  4.  Ischemic heart disease.  5.  Hypertension.   HISTORY OF PRESENT ILLNESS:  [name] is a 77 year old pleasant, Caucasian  lady who was admitted for evaluation of sudden onset of left facial, tongue  numbness, as well as some slurred speech. These symptoms lasted for about 30  to 40 minutes and resolved completely by the time she was seen in the  emergency room. The CT scan of the head was unremarkable.   HOSPITAL COURSE:  She was admitted to the stroke unit and was kept on  telemetry monitoring, which did not reveal any cardiac arrhythmias.  Subsequently a MRI scan of the brain was obtained, which revealed no acute  infarct. There was extensive white matter and __________ changes noted.  There was diffuse thickening of the skull noted, which represented  hyperostosis frontalis interna, which was an incidental finding. MRA of the  neck revealed no significant carotid stenosis. MRA of the neck revealed no  significant intracranial stenosis. Carotid ultrasound showed no significant  extra-cranial stenosis. A 2-D echocardiogram showed normal ejection fraction  without any obvious cardiac source of embolism. Hemoglobin A1c was elevated  at 7.5. Total cholesterol profile showed elevated LDL. The patient was on  Vytorin but she admitted that she had not been taking it regularly. Total  cholesterol is 211. LDL 142, triglycerides 139. Homocystine was normal. The  patient was started on Aggrenox for secondary stroke  prevention and advanced  to take her Vytorin regularly. She was advised to followup with her family  physician, Dr. Donia Guiles for tighter control of her sugars. She  remained stable during the hospitalization and had no further episodes and  no new problems.   DISPOSITION:  She was discharged home in stable condition.   FOLLOW UP:  Advised to followup with Dr. Donia Guiles next week and with  Dr. Pearlean Brownie in his office in 2 months.   DISCHARGE MEDICATIONS:  1.  Aggrenox 1 capsule daily for 2 weeks. To increase to twice a day.  2.  Vytorin 10/10 once a day.  3.  Allegra 180 mg daily.  4.  Benicar 40 mg daily.  5.  Lasix 40 mg daily.  6.  Actonel 30 mg once a week.  7.  Novolin insulin 70/30 - 20 units in the morning and 10 at night.  8.  Cardizem CD 240 mg a day.   DIET:  She was advised to be on a low-fat diet.   ACTIVITY:  To increase slowly as tolerated.           ______________________________  Pramod P. Pearlean Brownie, MD     PPS/MEDQ  D:  12/02/2004  T:  12/03/2004  Job:  [id number]   cc:   Donia Guiles, M.D.  Fax: [phone]

## 2010-06-03 NOTE — Consult Note (Signed)
NAMESAMMY, Lewis               ACCOUNT NO.:  [id number]   MEDICAL RECORD NO.:  [id number]          PATIENT TYPE:  EMS   LOCATION:  ED                           FACILITY:  [facility]   PHYSICIAN:  Bevelyn Buckles. Champey, M.D.DATE OF BIRTH:  [birth year]   DATE OF CONSULTATION:  DATE OF DISCHARGE:                                   CONSULTATION   REFERRING PHYSICIAN:  Lear Ng, M.D.   REASON FOR CONSULTATION:  Code stroke/TIA.   HISTORY OF PRESENT ILLNESS:  Ms. [name] is a 77 year old white female with  multiple medical problems who presents with acute onset of left facial,  tongue numbness and slurring of speech which started around 8:30 p.m. this  evening. The patient also stated that she had an intense bilateral frontal  headache which was worse on the right side with the onset. Her symptoms  gradually resolved over time and the patient states that she is back to  normal at the current time. She also felt generalized weakness at the time  and had some shortness of breath. She denies any focal weakness or extremity  numbness, vision changes, swallowing problems, chewing problems, dizziness,  vertigo, or loss of consciousness. She has not had any prior similar  episodes in the past.   PAST MEDICAL HISTORY:  1.  Positive for heart disease.  2.  Diabetes.  3.  Guillain-Barre in 1987.  4.  Hypertension.  5.  High cholesterol.   CURRENT MEDICATIONS:  Benicar, Cardizem, Allegra, aspirin, Lasix, Novolin  70/30, Vytorin, Actonel, and Tylenol p.r.n.   ALLERGIES:  The patient has drug allergy to PENICILLIN.   FAMILY HISTORY:  Positive for hypertension and strokes.   SOCIAL HISTORY:  The patient currently lives with her husband. Denies any  smoking or alcohol use.   REVIEW OF SYSTEMS:  Positive as per HPI and also positive for shortness of  breath, constipation, arthritis, and anxiety. Negative as per history of  present illness and greater than seven other organ systems.   PHYSICAL  EXAMINATION:  VITAL SIGNS:  Temperature is 97.6, pulse is 70,  respirations 20, blood pressure 193/84.  HEENT:  Normocephalic and atraumatic. Extraocular movements intact. Pupils  are equal, round, and reactive to light.  NECK:  Supple. No carotid bruits.  HEART:  Regular.  LUNGS:  Clear.  ABDOMEN:  Soft and nontender.  EXTREMITIES:  Trace edema and good pulses throughout.  NEUROLOGICAL:  The patient is alert and oriented x3. Memory, judgment within  normal limits. Cranial nerves II through XII are grossly intact. Motor  examination shows 5/5 strength and normal tone in all four extremities. No  drift is noted. Sensory examination is within normal limits to light touch  and pinprick throughout. Reflexes are 1 to 2+ throughout. Toes are downgoing  bilaterally. Cerebellar function is within normal limits to finger-to-nose  gait. The patient has a wide-based gait.   PROCEDURE:  CT scan:  The patient had chronic small vessel disease  throughout and hypodensities noted in the left basoganglion and external  capsule.   LABORATORY DATA:  WBC is 6.2, hemoglobin 14.2, hematocrit  40.9, and  platelets was 215,000.   IMPRESSION:  This 77 year old white female with sudden onset of left face  and tongue numbness and slurred speech that has gradually resolved. This is  concerning for a posterior circulation transient ischemic attack or small  vessel stroke. We will transfer the patient to [facility] and admit  the patient to the stroke service for stroke/transient ischemic attack  workup. We will get a MRI/MRA of the brain, carotid Doppler, and 2-D  echocardiogram. We will change her aspirin to Plavix and we will continue  her home medications. We will keep the patient n.p.o. until she is cleared  by speech and we will place her on intravenous fluids at 75 mL per hour. We  will check her lipids, homocystines levels, hemoglobin A1c, and Coagulases.  We will get physical therapy,  occupational therapy, and speech consult as  well. We will place the patient on deep vein thrombosis prophylaxis.           ______________________________  Bevelyn Buckles. Nash Shearer, M.D.     DRC/MEDQ  D:  11/30/2004  T:  11/30/2004  Job:  [id number]

## 2010-06-10 ENCOUNTER — Other Ambulatory Visit: Payer: Self-pay | Admitting: *Deleted

## 2010-06-10 NOTE — Telephone Encounter (Signed)
I don't see vicodin  on her medication list

## 2010-06-10 NOTE — Telephone Encounter (Signed)
When I unclicked current meds it is there, last filled 05/18/10

## 2010-06-14 MED ORDER — HYDROCODONE-ACETAMINOPHEN 2.5-500 MG PO TABS: 1.0000 | ORAL_TABLET | Freq: Four times a day (QID) | ORAL | Status: AC | PRN

## 2010-06-14 NOTE — Telephone Encounter (Signed)
Ok 40, no RF 

## 2010-06-14 NOTE — Telephone Encounter (Signed)
duplicate

## 2010-07-18 ENCOUNTER — Other Ambulatory Visit: Payer: Self-pay | Admitting: Internal Medicine

## 2010-07-18 MED ORDER — HYDROCODONE-ACETAMINOPHEN 2.5-500 MG PO TABS: 1.0000 | ORAL_TABLET | Freq: Four times a day (QID) | ORAL | Status: AC | PRN

## 2010-08-23 ENCOUNTER — Ambulatory Visit (INDEPENDENT_AMBULATORY_CARE_PROVIDER_SITE_OTHER): Payer: Medicare Other | Admitting: Internal Medicine

## 2010-08-23 ENCOUNTER — Other Ambulatory Visit: Payer: Self-pay | Admitting: Internal Medicine

## 2010-08-23 ENCOUNTER — Encounter: Payer: Self-pay | Admitting: Internal Medicine

## 2010-08-23 DIAGNOSIS — M1990 Unspecified osteoarthritis, unspecified site: Secondary | ICD-10-CM

## 2010-08-23 DIAGNOSIS — E119 Type 2 diabetes mellitus without complications: Secondary | ICD-10-CM

## 2010-08-23 DIAGNOSIS — I10 Essential (primary) hypertension: Secondary | ICD-10-CM

## 2010-08-23 LAB — BASIC METABOLIC PANEL
GFR: 75.09 mL/min (ref >60.00)
Potassium: 3.7 mEq/L (ref 3.5–5.1)
Sodium: 143 mEq/L (ref 135–145)

## 2010-08-23 MED ORDER — SIMVASTATIN 20 MG PO TABS: 20.0000 mg | ORAL_TABLET | Freq: Every day | ORAL | Status: AC

## 2010-08-23 MED ORDER — HYDROCODONE-ACETAMINOPHEN 2.5-500 MG PO TABS: 1.0000 | ORAL_TABLET | Freq: Four times a day (QID) | ORAL | Status: DC | PRN

## 2010-08-23 MED ORDER — DILTIAZEM HCL ER COATED BEADS 300 MG PO CP24: 300.0000 mg | ORAL_CAPSULE | Freq: Every day | ORAL | Status: AC

## 2010-08-23 MED ORDER — LOSARTAN POTASSIUM 50 MG PO TABS: 50.0000 mg | ORAL_TABLET | Freq: Two times a day (BID) | ORAL | Status: AC

## 2010-08-23 MED ORDER — FEXOFENADINE HCL 180 MG PO TABS: 180.0000 mg | ORAL_TABLET | Freq: Every day | ORAL | Status: AC

## 2010-08-23 NOTE — Assessment & Plan Note (Signed)
Excellent BP today, states she is doing better w/ diet

## 2010-08-23 NOTE — Assessment & Plan Note (Signed)
Will check a a1C Need more information regards CBGs to adjust insulin, see instructions

## 2010-08-23 NOTE — Telephone Encounter (Signed)
Rx[s] Done. 

## 2010-08-23 NOTE — Patient Instructions (Signed)
Check your blood sugars 3 times a day: Before breakfast, before dinner and at bedtime Bring a log in 2 weeks

## 2010-08-23 NOTE — Progress Notes (Signed)
  Subjective:    Patient ID: [name], female    DOB: 1926-08-22, 77 y.o.   MRN: [id number]  HPI Routine office visit, in general feeling well.  Past Medical History  Diagnosis Date  . Osteoarthritis   . Cancer 1974    type? s/p removal of the 3rd R rib  . Cerebrovascular accident     hx of x 2  . History of cardiac cath 2006    @ [facility]: non critical dz, Ef 65%  . Osteoporosis   . GI bleed 08/2009    admitted to [facility]   Past Surgical History  Procedure Date  . Tonsillectomy   . Rib removed     3rd Right rib, due to cancer  . Cholecystectomy   . Natural birth     9     Review of Systems Based on the last hemoglobin A1c we recommended either refer to endocrinology or increase her insulin, she decided to increase her insulin. Blood sugars in the morning ranging from 120-170, blood sugars in the afternoon are "okay". Can not give me further details. As far as DJD, she's not taking any plain Tylenol, she is taking Vicodin as prescribed. Has been taking about one a day but would like to take more. We'll do a new prescription. High blood pressure seems to be better. She has improved her diet and eating healthier. Needs BP meds refill today     Objective:   Physical Exam  Constitutional: She is oriented to person, place, and time. She appears well-developed and well-nourished. No distress.  Cardiovascular: Normal rate, regular rhythm and normal heart sounds.   No murmur heard. Pulmonary/Chest: Effort normal and breath sounds normal. No respiratory distress. She has no wheezes. She has no rales.  Musculoskeletal: She exhibits no edema.  Neurological: She is alert and oriented to person, place, and time.  Skin: Skin is warm and dry. She is not diaphoretic.  Psychiatric: She has a normal mood and affect. Her behavior is normal. Judgment and thought content normal.          Assessment & Plan:

## 2010-08-23 NOTE — Assessment & Plan Note (Addendum)
Likes to have enough to vicodin to take ~ twice a day, will Rx 90 plus RF. Pt is reconsidering surgery, asked to call when ready to be referred to ortho

## 2010-08-24 ENCOUNTER — Other Ambulatory Visit: Payer: Self-pay | Admitting: Internal Medicine

## 2010-08-24 MED ORDER — FUROSEMIDE 40 MG PO TABS: 40.0000 mg | ORAL_TABLET | Freq: Every day | ORAL | Status: AC

## 2010-08-24 NOTE — Telephone Encounter (Signed)
Rx Done . 

## 2010-08-25 ENCOUNTER — Telehealth: Payer: Self-pay

## 2010-08-25 NOTE — Telephone Encounter (Signed)
Called patient to inform of Dr.Paz response to lab (no answer/no voicemail), will try to reach patient again later

## 2010-08-25 NOTE — Telephone Encounter (Signed)
Message copied by Edgardo Roys on Thu Aug 25, 2010  3:09 PM ------      Message from: [name]      Created: Wed Aug 24, 2010  5:16 PM       Advise patient:      Diabetes is improving, I still would like her to call me with her blood sugar readings in 2 weeks. Good results

## 2010-08-29 NOTE — Telephone Encounter (Signed)
Attempted to reached Pt again; no answer, no answering machine. Will try to reach through emergency contact and/or other resources.

## 2010-09-02 NOTE — Telephone Encounter (Signed)
Tried to reach patient again, no answer-no answering service. Labs already mailed to patient.

## 2010-09-09 ENCOUNTER — Telehealth: Payer: Self-pay | Admitting: Internal Medicine

## 2010-09-09 NOTE — Telephone Encounter (Signed)
Pt blood sugar reading over a two period  8/8- am-160            8/12- 175          8/16- 122            8/20- 120         5pm-170                  170                    127            136                 Pm- 143                   135                   187                      141  8/9- 149         8/13- 137          8/17- 119            8/21- 122        140        155        145             150            107                  111                   99                        95   8/10-152                  8/14- 132          8/18- 121         150                            122                   139         200                            137                   160  8/11-110                  8/15- 143          8/19- 106         190                           170                    95         160                  183  128 

## 2010-09-12 NOTE — Telephone Encounter (Signed)
Spoke w/ pt aware of information also given 50 unit syringes so that she can have room to draw up medication since current syringes aren't going to work well w/ new instructions.

## 2010-09-12 NOTE — Telephone Encounter (Signed)
Last hemoglobin A1c 7.0. Plan: Increase NPH from 30 units twice a day to 33 units twice a day. Watch for low blood sugars.

## 2010-10-17 ENCOUNTER — Telehealth: Payer: Self-pay | Admitting: *Deleted

## 2010-10-17 LAB — GLUCOSE, CAPILLARY

## 2010-10-17 LAB — COMPREHENSIVE METABOLIC PANEL
Alkaline Phosphatase: 68
BUN: 12
CO2: 25
Chloride: 108
GFR calc non Af Amer: >60
Glucose, Bld: 214 — ABNORMAL HIGH
Potassium: 4.3
Total Bilirubin: 0.5
Total Protein: 6.4

## 2010-10-17 LAB — DIFFERENTIAL
Basophils Absolute: 0
Basophils Relative: 0
Neutro Abs: 4.8
Neutrophils Relative %: 73

## 2010-10-17 LAB — CK TOTAL AND CKMB (NOT AT ARMC): Total CK: 29

## 2010-10-17 LAB — APTT: aPTT: 27

## 2010-10-17 LAB — CBC
Hemoglobin: 13.9
MCHC: 33.6
RDW: 13.3

## 2010-10-17 LAB — PROTIME-INR
INR: 1
Prothrombin Time: 12.8

## 2010-10-17 NOTE — Telephone Encounter (Signed)
Patient currently taking 33 units BID: New Verbal Order per Dr. Drue Novel: Patient informed to increase from 33 units to 36 units AM                                Decrease from 33 units to 30 units PM Call if still having problems or new issues arise; patient states she will be out of town for [2] weeks. Informed Pt to call us 24-hr day [after hour nurse] from wherever she may be if she needs [place]. Patient understands new orders.

## 2010-10-17 NOTE — Telephone Encounter (Signed)
Blood sugars in the morning seem appropriate, occasional low readings at 49. At night, CBGs seems to slightly higher Plan: Change from Novolin 30 units twice a day to------------>   35 units in the morning and 25 at night.  I think that way will prevent a low  in the morning

## 2010-10-17 NOTE — Telephone Encounter (Signed)
Pt c/o "waking up ringing wet at night" and excessive fatigue; Blood Sugar Readings: 09/13/10: 119  111 09/14/10: 94  121 09/15/10: 115 133 09/16/10: 133  124 09/17/10: 92  112 09/18/10: 95 130 09/19/10: 115  118 09/20/10: 140  125 09/21/10: 105  85 09/22/10: 115  87 09/23/10: 82  160 09/24/10:  95  110 09/25/10: 120  N/A-night 09/26/10: 190 105 09/27/10: 152  115 09/28/10: 125  150 09/29/10: 111 157 09/30/10: 114  150 10/01/10: 126  N/A-night 10/02/10: 115  112 10/03/10: 117 150 10/04/10: 112  123 10/05/10: 118  99 10/06/10: 80  N/A-night 10/07/10: 49  181 10/08/10: 107  130 10/09/10: 160  223 10/10/10: 90  128 10/11/10: 113  190 10/12/10: 133  N/A-night 10/13/10: 157  145 10/14/10: 136  175 10/15/10: 125  133 10/16/10: 140  143 10/17/10: 170am [after eating a half box of graham crackers at 03:05am]  Patient believes that Insulin needs to be lowered. Please Advise.

## 2010-10-18 LAB — GLUCOSE, CAPILLARY
Glucose-Capillary: 132 — ABNORMAL HIGH
Glucose-Capillary: 161 — ABNORMAL HIGH
Glucose-Capillary: 175 — ABNORMAL HIGH
Glucose-Capillary: 193 — ABNORMAL HIGH

## 2010-10-18 LAB — URINALYSIS, ROUTINE W REFLEX MICROSCOPIC
Bilirubin Urine: NEGATIVE
Glucose, UA: NEGATIVE
Hgb urine dipstick: NEGATIVE
Protein, ur: NEGATIVE
Urobilinogen, UA: 0.2

## 2010-10-19 ENCOUNTER — Other Ambulatory Visit: Payer: Self-pay | Admitting: Internal Medicine

## 2010-10-20 NOTE — Telephone Encounter (Signed)
Denied, too eraly

## 2010-10-20 NOTE — Telephone Encounter (Signed)
Pt given #90 on 8/7.  Last seen on 08/23/10, follow up scheduled on 11.1.12.

## 2010-10-28 LAB — BASIC METABOLIC PANEL
BUN: 10
Calcium: 8.9
Creatinine, Ser: 0.93
GFR calc non Af Amer: 58 — ABNORMAL LOW
Glucose, Bld: 197 — ABNORMAL HIGH
Potassium: 3.4 — ABNORMAL LOW

## 2010-10-28 LAB — CBC
HCT: 39.9
Platelets: 234
RDW: 13.5
WBC: 6.8

## 2010-10-28 LAB — URINE MICROSCOPIC-ADD ON

## 2010-10-28 LAB — URINALYSIS, ROUTINE W REFLEX MICROSCOPIC
Bilirubin Urine: NEGATIVE
Nitrite: NEGATIVE
Specific Gravity, Urine: 1.01
Urobilinogen, UA: 0.2
pH: 6.5

## 2010-10-28 LAB — URINE CULTURE: Colony Count: 6000

## 2010-10-28 LAB — DIFFERENTIAL
Basophils Absolute: 0
Eosinophils Relative: 2
Lymphocytes Relative: 32
Lymphs Abs: 2.2
Neutro Abs: 4.1
Neutrophils Relative %: 61

## 2010-10-31 NOTE — Telephone Encounter (Signed)
Patient request for Hydrocodone-APAP 2.5-500 [last refill 08/23/10 #90x1]

## 2010-10-31 NOTE — Telephone Encounter (Signed)
Ok 90 and 1 RF 

## 2010-10-31 NOTE — Telephone Encounter (Signed)
Pt is calling again about refill.

## 2010-11-01 MED ORDER — HYDROCODONE-ACETAMINOPHEN 2.5-500 MG PO TABS: 1.0000 | ORAL_TABLET | Freq: Four times a day (QID) | ORAL | Status: AC | PRN

## 2010-11-01 NOTE — Telephone Encounter (Signed)
Done

## 2010-11-01 NOTE — Telephone Encounter (Signed)
Addended by: Regis Bill on: 11/01/2010 09:04 AM   Modules accepted: Orders

## 2010-11-14 ENCOUNTER — Ambulatory Visit (INDEPENDENT_AMBULATORY_CARE_PROVIDER_SITE_OTHER): Payer: Medicare Other | Admitting: Internal Medicine

## 2010-11-14 ENCOUNTER — Encounter: Payer: Self-pay | Admitting: Internal Medicine

## 2010-11-14 VITALS — BP 160/82 | HR 77 | Temp 98.4°F | Wt 213.0 lb

## 2010-11-14 DIAGNOSIS — E119 Type 2 diabetes mellitus without complications: Secondary | ICD-10-CM

## 2010-11-14 DIAGNOSIS — J40 Bronchitis, not specified as acute or chronic: Secondary | ICD-10-CM

## 2010-11-14 MED ORDER — DOXYCYCLINE HYCLATE 100 MG PO TABS: 100.0000 mg | ORAL_TABLET | Freq: Two times a day (BID) | ORAL | Status: AC

## 2010-11-14 NOTE — Patient Instructions (Addendum)
Rest, fluids , tylenol For cough, take Mucinex DM twice a day as needed If the cough continue, take hydrocodone ( a pain killer but also a cough supressant )  Take the antibiotic as prescribed---> doxycycline x  7 days  Call if no better in few days Call anytime if the symptoms are severe, you have high fever, short of breath, persistent chest pain Call with the sugar readings in 10 days

## 2010-11-14 NOTE — Assessment & Plan Note (Signed)
Reports low blood sugars mostly at night. Change in sitting from 36 and 30 units to 36 and 25 units. Encouraged to call me with actual CBG readings.  Next next visit in one month

## 2010-11-14 NOTE — Assessment & Plan Note (Signed)
See instructions

## 2010-11-14 NOTE — Progress Notes (Signed)
  Subjective:    Patient ID: [name], female    DOB: 12-01-1926, 77 y.o.   MRN: [id number]  HPI 6 days ago developed a sore throat, severe cough, sinus congestion. Went to urgent care, prescribed a Z-Pak which she finished already. She's not doing any better, "usually it takes 2 rounds of antibiotics to get me better".  Past Medical History  Diagnosis Date  . Osteoarthritis   . Cancer 1974    type? s/p removal of the 3rd R rib  . Cerebrovascular accident     hx of x 2  . History of cardiac cath 2006    @ [facility]: non critical dz, Ef 65%  . Osteoporosis   . GI bleed 08/2009    admitted to [facility]  . Diabetes mellitus       Review of Systems She is having subjective fever, no nausea, vomiting, diarrhea. Some chest pain with cough only. No myalgias. As far as her diabetes, she is taking 36 and 30 units of insulin, still occasionally has low blood sugars in the 60s,  Can not  elaborate more except that it happens sometimes at night.    Objective:   Physical Exam  Constitutional: She appears well-developed and well-nourished. No distress.  HENT:  Head: Normocephalic and atraumatic.  Right Ear: External ear normal.  Left Ear: External ear normal.  Mouth/Throat: No oropharyngeal exudate.  Cardiovascular: Normal rate, regular rhythm and normal heart sounds.   Pulmonary/Chest:       Audible  chest congestion, she has rhonchi but no wheezing. No increased work of breathing or crackles.  Musculoskeletal: She exhibits no edema.  Skin: She is not diaphoretic.          Assessment & Plan:

## 2010-11-15 ENCOUNTER — Ambulatory Visit: Payer: Medicare Other | Admitting: Internal Medicine

## 2010-11-17 ENCOUNTER — Ambulatory Visit: Payer: Medicare Other | Admitting: Internal Medicine

## 2011-01-02 ENCOUNTER — Encounter: Payer: Self-pay | Admitting: Internal Medicine

## 2011-02-09 IMAGING — CR DG CHEST 2V
2 series · 2 of 2 positions shown · non-contrast
Comparison: 11/16/2007

CLINICAL DATA: Cough.

CHEST - 2 VIEW

[view not recorded (1 of 2)]
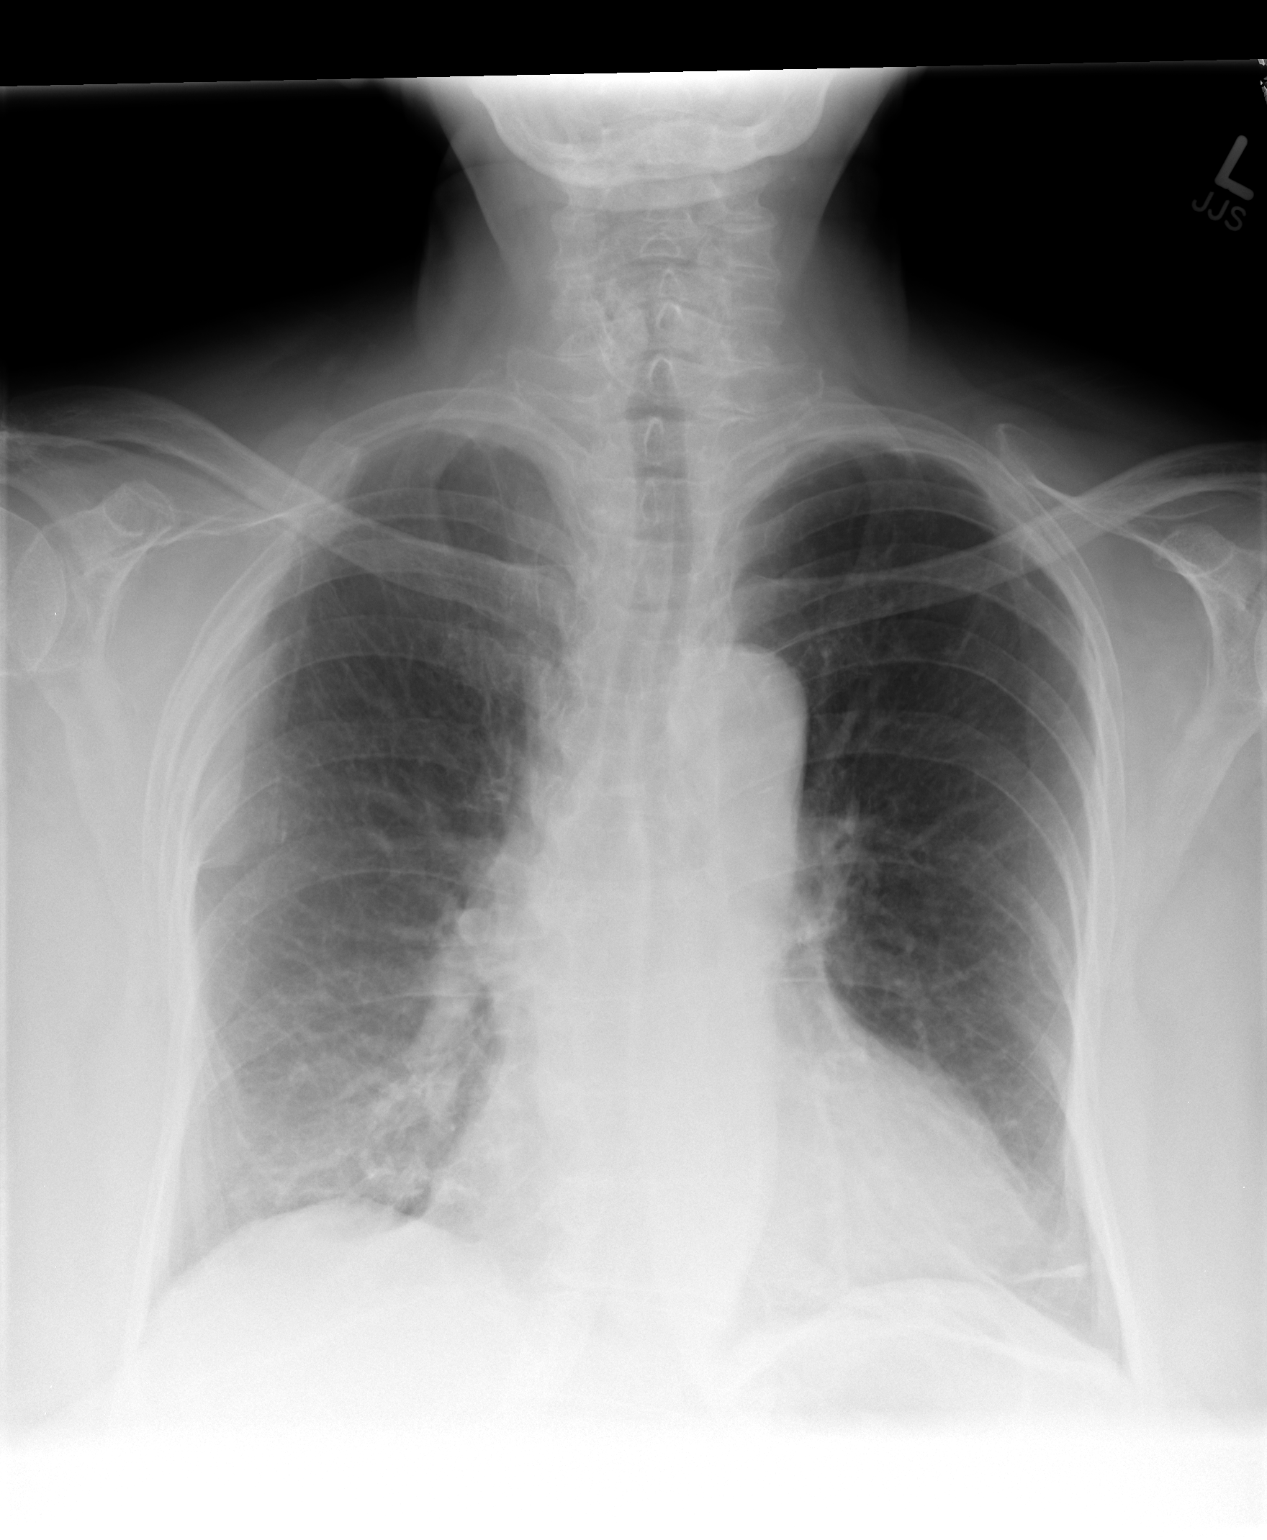

[view not recorded (2 of 2)]
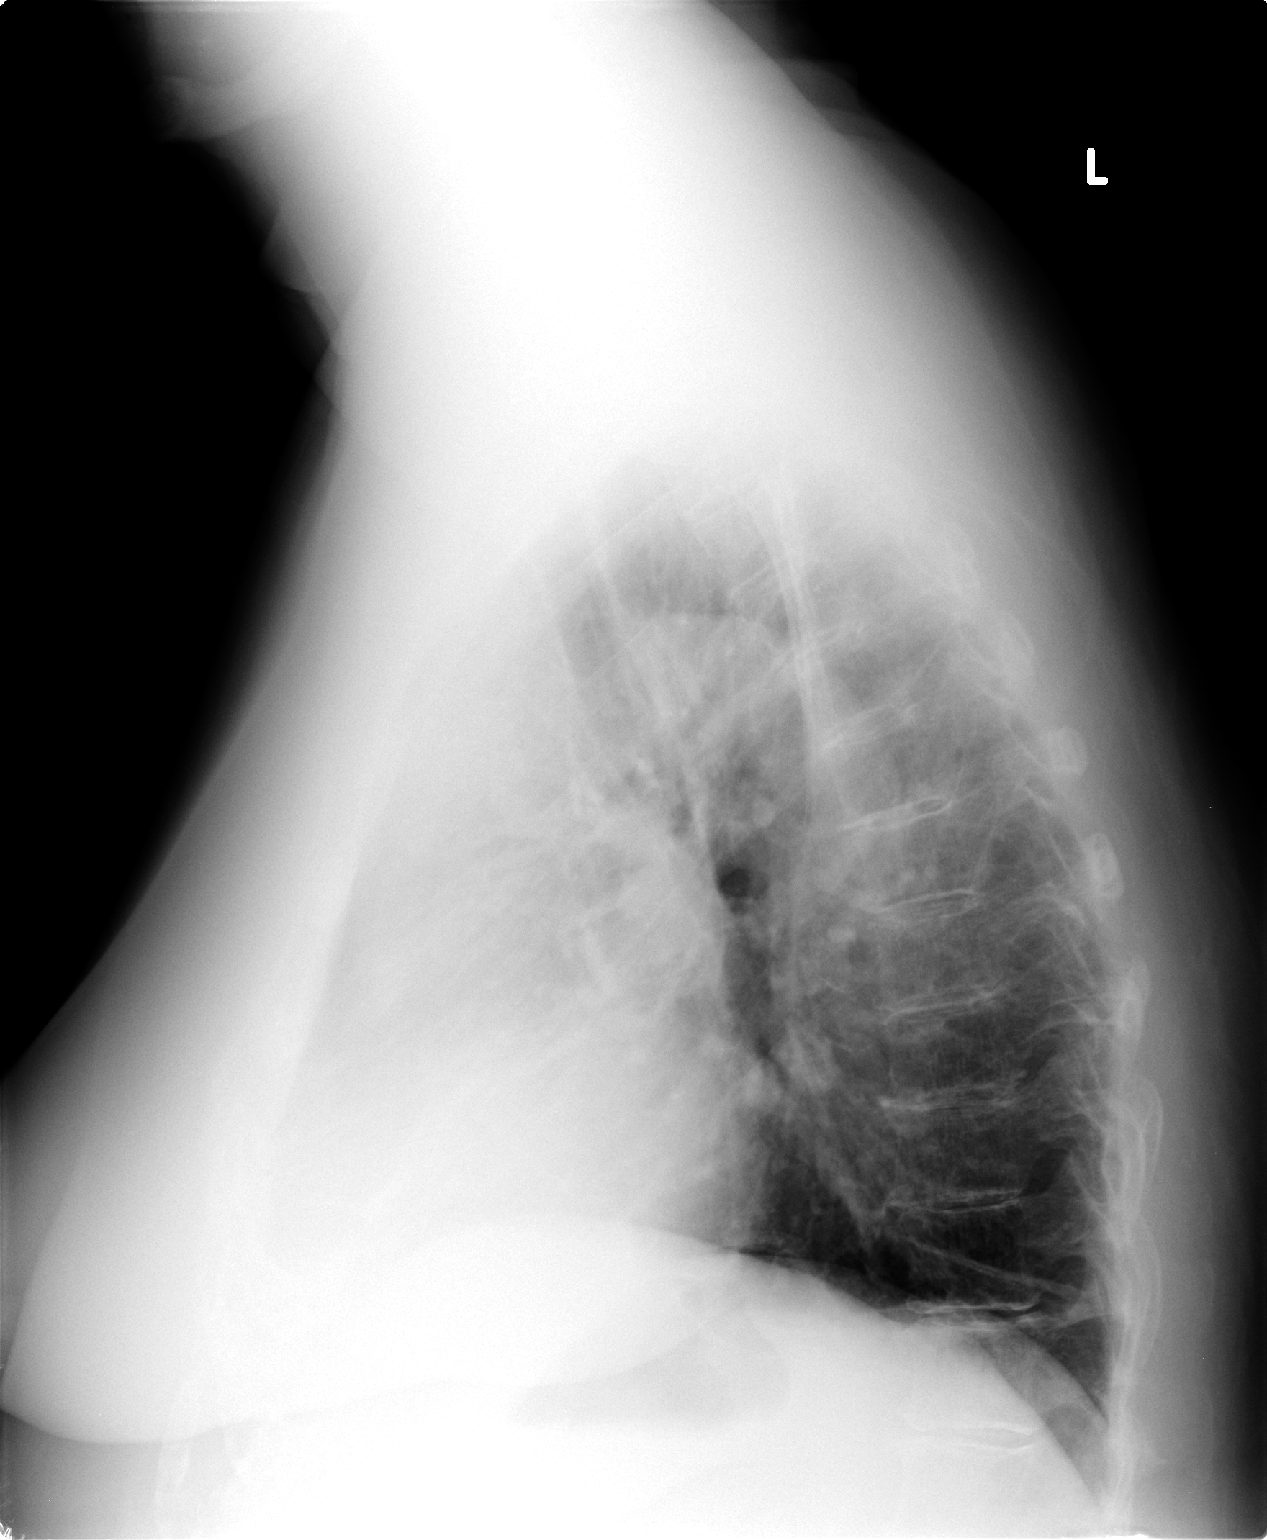

[2 of 2 positions shown; findings below may reference images not displayed]

FINDINGS: Trachea is midline.  Heart is enlarged.  Scar is seen in
the left costophrenic angle.  Biapical pleural thickening.  Opacity
at the right lung base seen on the frontal view, is not readily
seen on the lateral view and may be due to overlying soft tissue.
Deformity of the right rib cage is presumably post-traumatic or
postsurgical etiology.
IMPRESSION: No acute findings.

## 2012-08-23 IMAGING — CR DG NECK SOFT TISSUE
1 series · 1 of 1 positions shown · non-contrast
Comparison: None.

CLINICAL DATA: Throat pain.  Soreness.

NECK SOFT TISSUES - 1+ VIEW

[view not recorded]
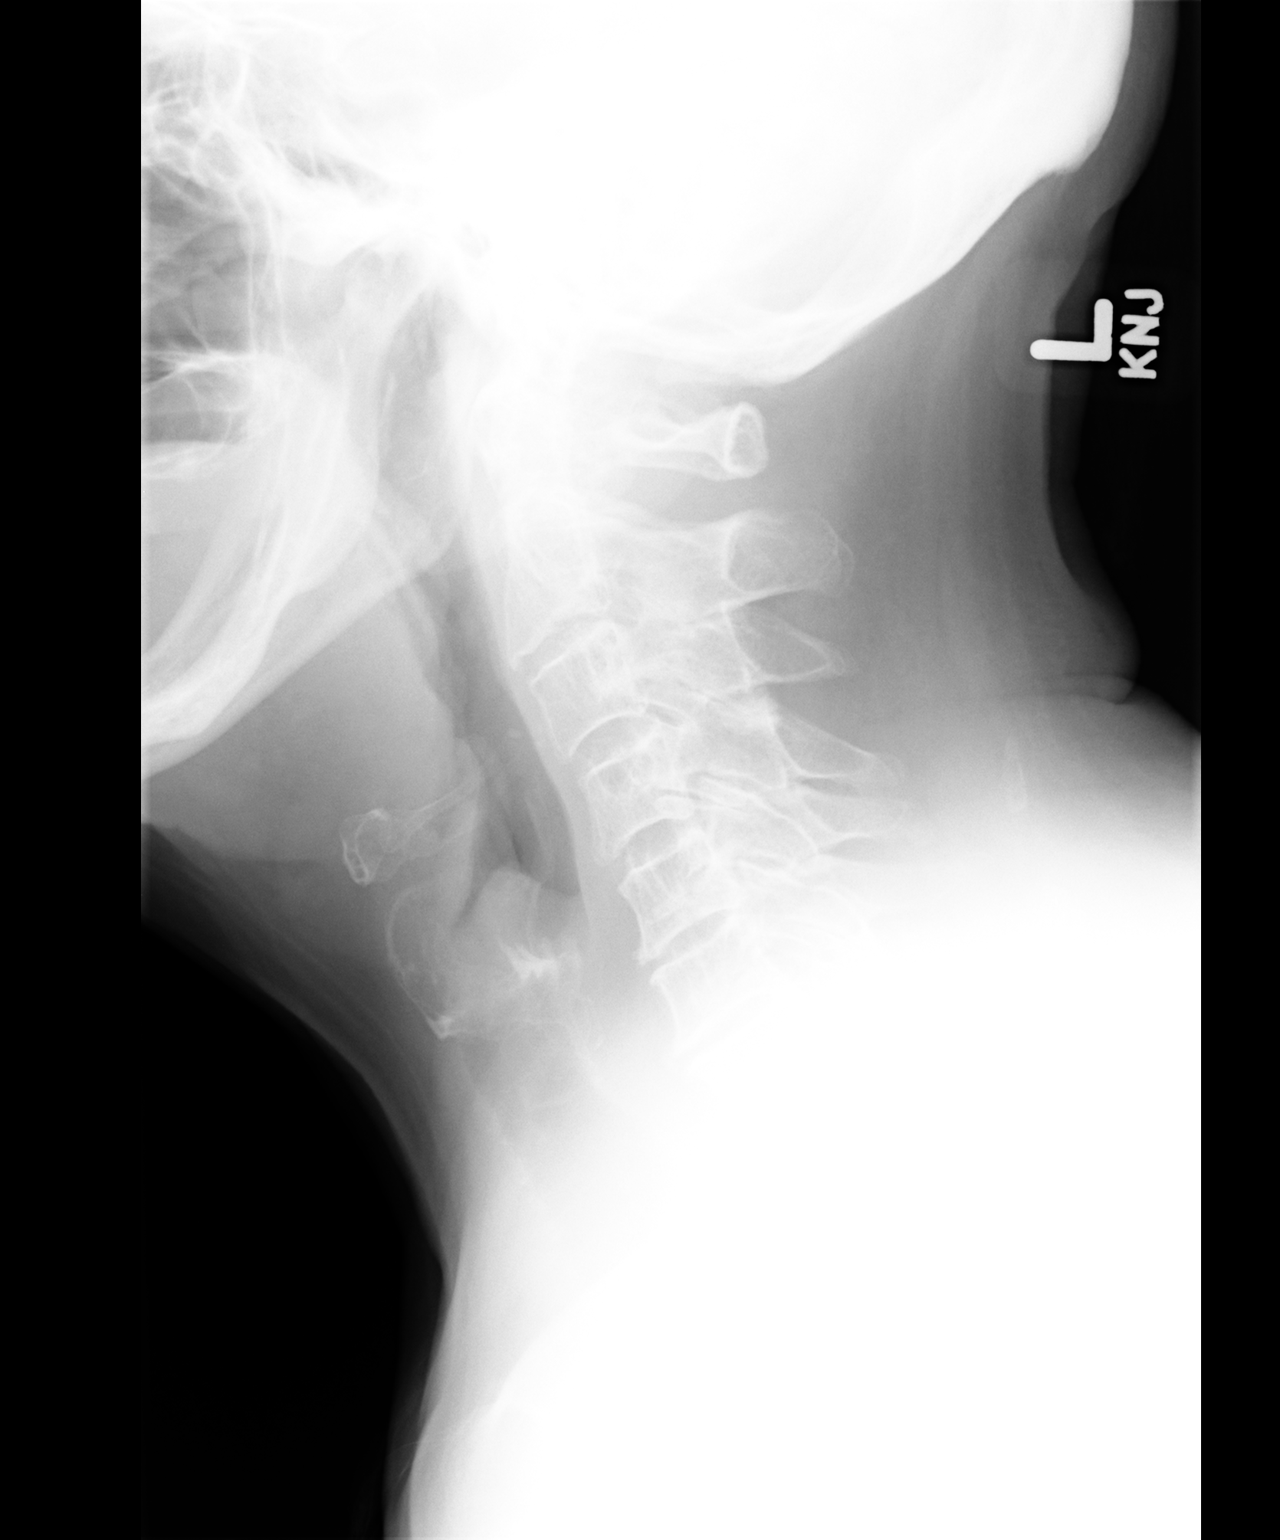

[1 of 1 positions shown; findings below may reference images not displayed]

FINDINGS: The airway is patent.  No significant prevertebral soft
tissue abnormality is identified.  The epiglottis is within normal
limits.  No radiopaque foreign body is seen.  Calcifications of the
thyroid cartilage are within normal limits.  Mild degenerative
changes are noted in the cervical spine.
IMPRESSION: 1.  No focal soft tissue abnormality to explain the patients
symptoms.
2.  Mild degenerative changes of the cervical spine.

## 2012-08-23 IMAGING — CR DG CHEST 2V
2 series · 2 of 2 positions shown · non-contrast
Comparison: 03/02/2008

CLINICAL DATA: Sore throat

CHEST - 2 VIEW

[view not recorded (1 of 2)]
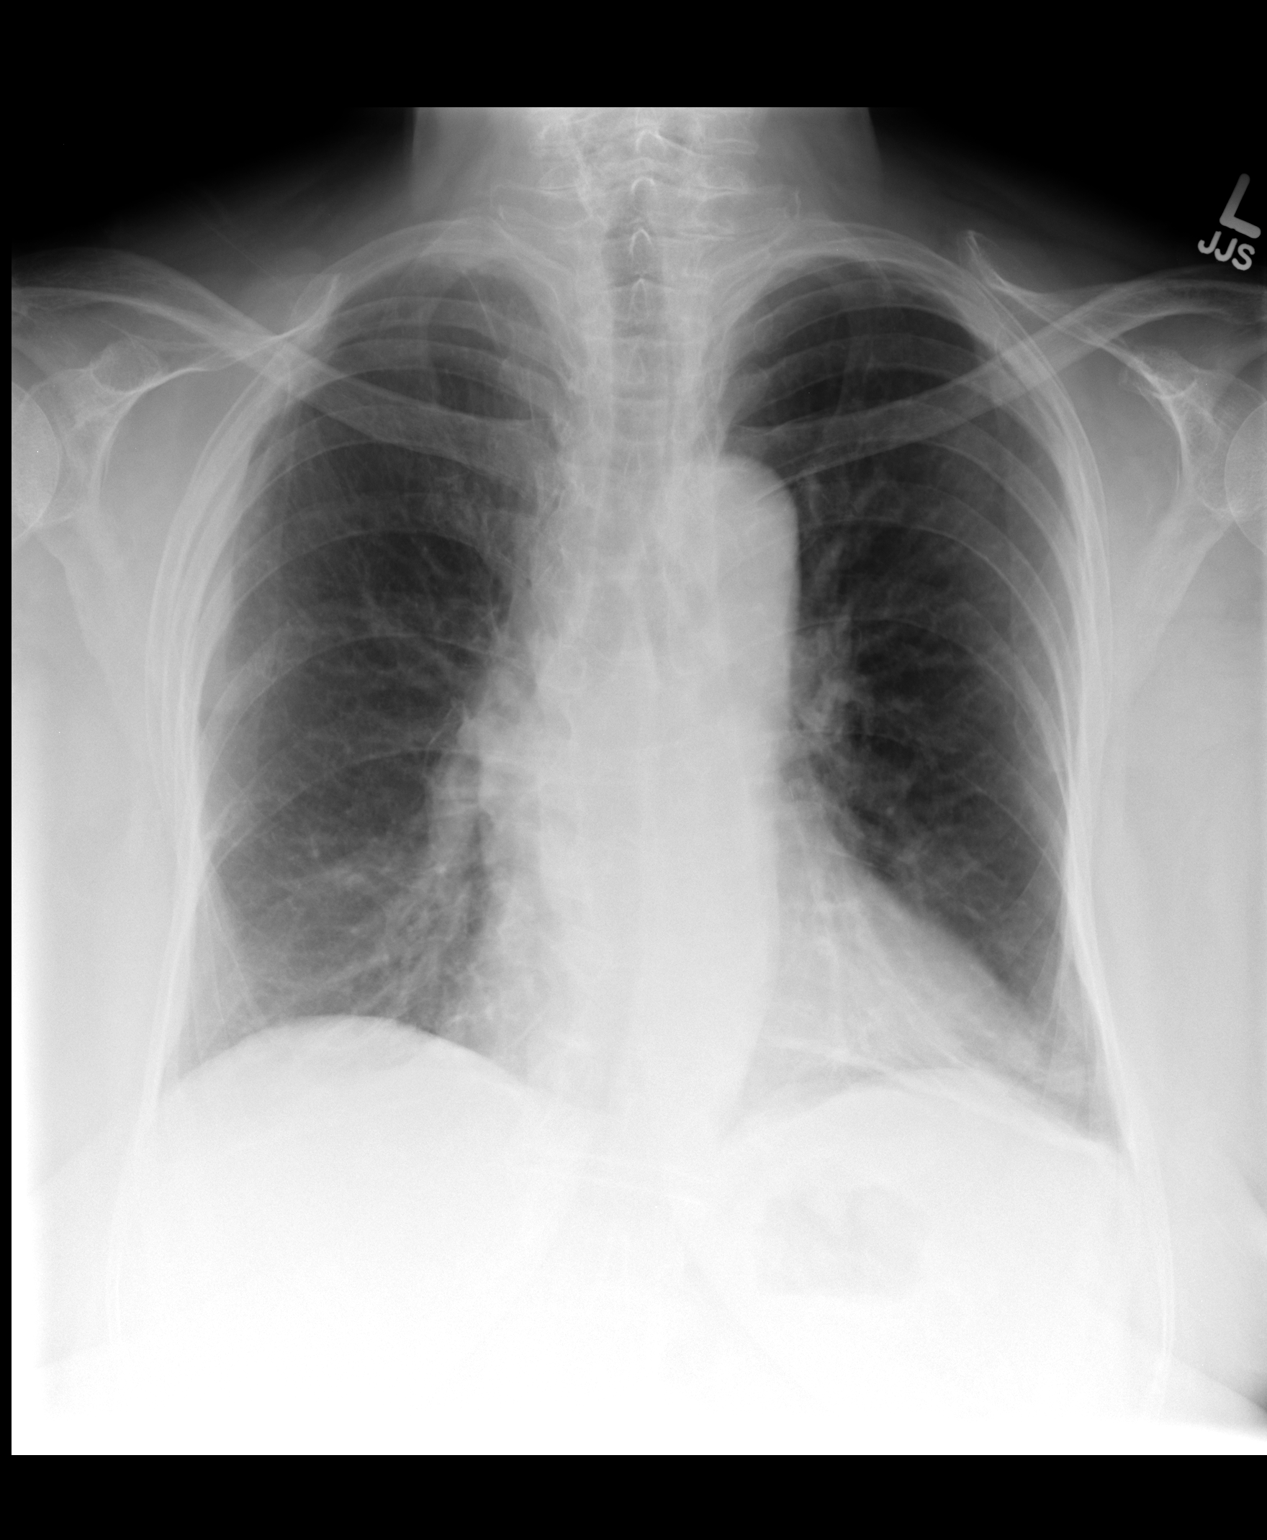

[view not recorded (2 of 2)]
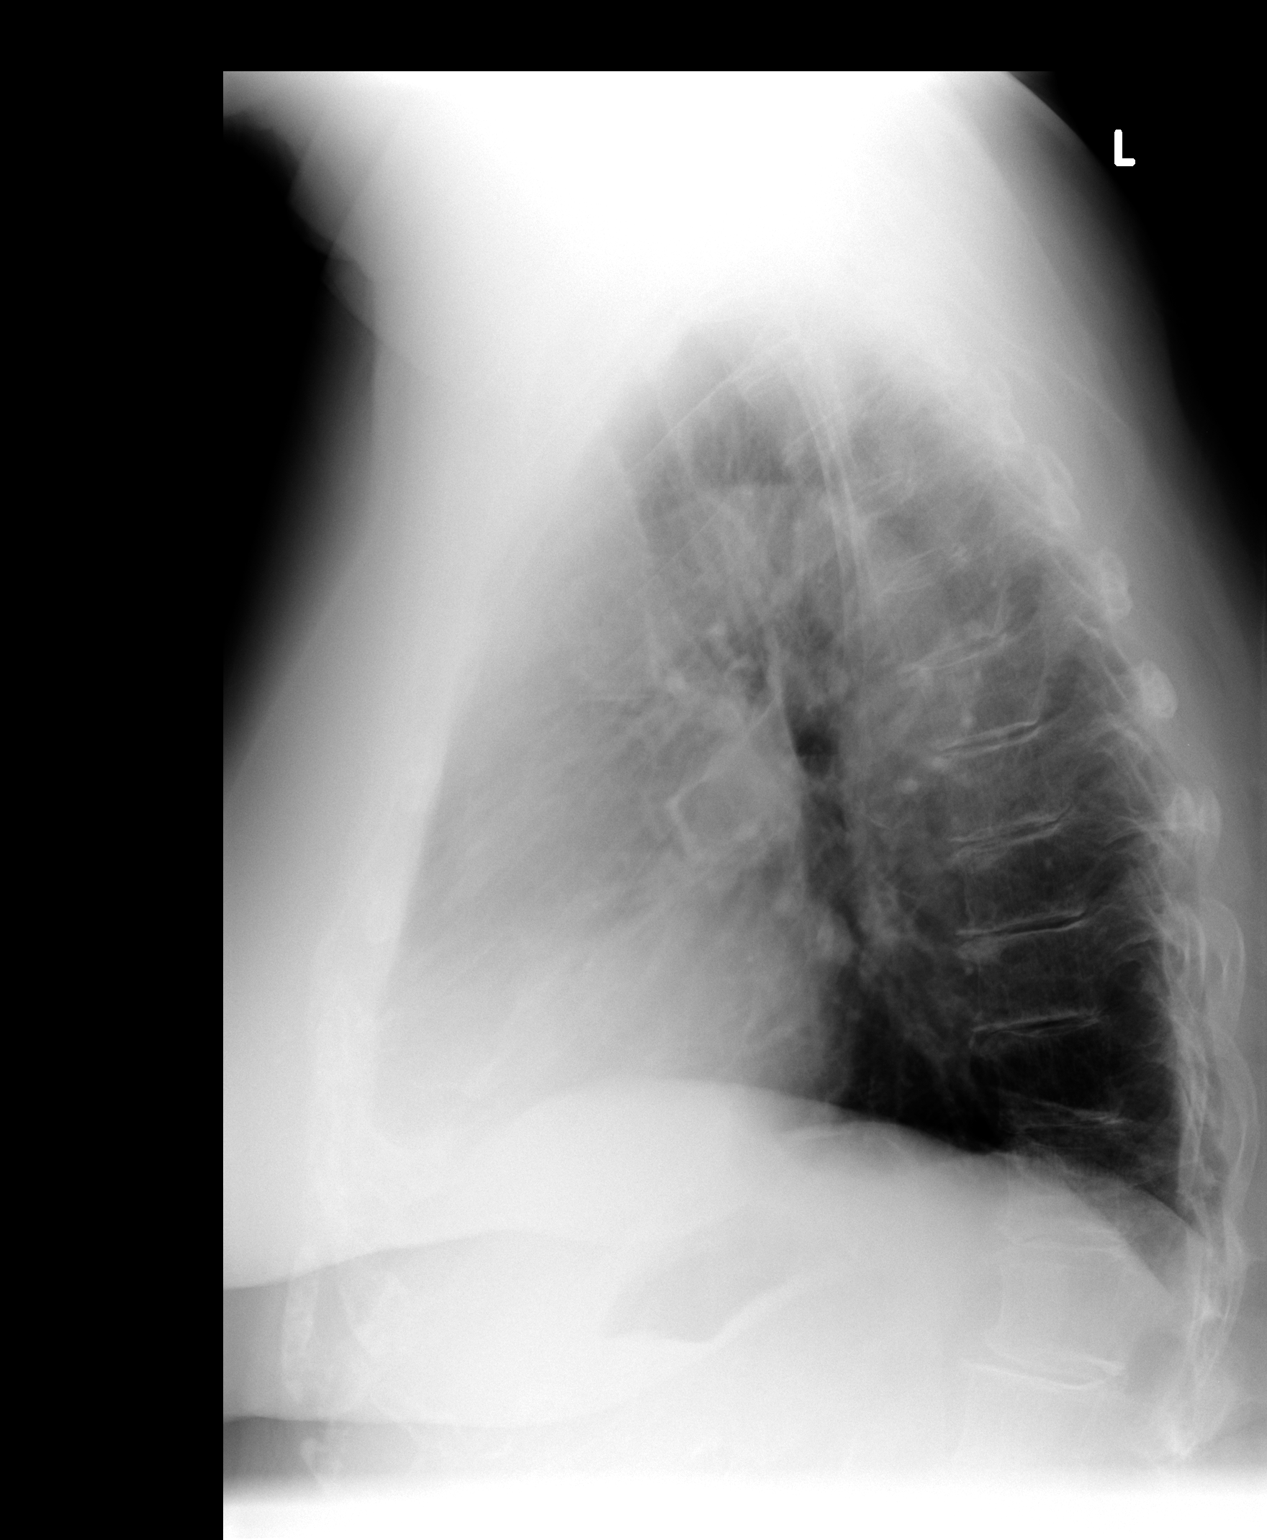

[2 of 2 positions shown; findings below may reference images not displayed]

FINDINGS: Heart mildly enlarged.  Aorta tortuous.  Lungs
hyperaerated with scarring at the left base and bilateral pleural
thickening.  Right rib deformities again noted.
IMPRESSION: Cardiomegaly and chronic changes as above.  No acute findings or
interval change since 03/02/2008.

## 2017-05-13 ENCOUNTER — Encounter (HOSPITAL_COMMUNITY): Payer: Self-pay | Admitting: Emergency Medicine

## 2017-05-13 ENCOUNTER — Emergency Department (HOSPITAL_COMMUNITY)
Admission: EM | Admit: 2017-05-13 | Discharge: 2017-05-16 | Disposition: E | Payer: Medicare Other | Attending: Emergency Medicine | Admitting: Emergency Medicine

## 2017-05-13 DIAGNOSIS — I469 Cardiac arrest, cause unspecified: Secondary | ICD-10-CM | POA: Diagnosis not present

## 2017-05-13 DIAGNOSIS — E119 Type 2 diabetes mellitus without complications: Secondary | ICD-10-CM | POA: Insufficient documentation

## 2017-05-13 DIAGNOSIS — Z79899 Other long term (current) drug therapy: Secondary | ICD-10-CM | POA: Diagnosis not present

## 2017-05-13 DIAGNOSIS — I10 Essential (primary) hypertension: Secondary | ICD-10-CM | POA: Insufficient documentation

## 2017-05-13 DIAGNOSIS — Z859 Personal history of malignant neoplasm, unspecified: Secondary | ICD-10-CM | POA: Diagnosis not present

## 2017-05-13 MED ORDER — EPINEPHRINE PF 1 MG/10ML IJ SOSY
PREFILLED_SYRINGE | INTRAMUSCULAR | Status: AC | PRN
  Administered 2017-05-13: 1 mg via INTRAVENOUS

## 2017-05-13 NOTE — ED Notes (Signed)
Per [place] [facility], pt is a full release. I spoke with Leonor Liv  [phone]

## 2017-05-13 NOTE — ED Provider Notes (Signed)
Emergency Department Provider Note   I have reviewed the triage vital signs and the nursing notes.   HISTORY  Chief Complaint Cardiac Arrest   HPI [name] is a 84 y.o. female with PMH of DM, CVA, and OA resents to the emergency department in cardiac arrest by EMS.  The patient lives at home with her husband who has dementia.  He called paramedics from a neighbor's house saying that his wife passed away.  Family is not sure exactly when she became unresponsive.  EMS report that she was initially and ventricular tachycardia.  She was defibrillated a total of 4 times.  She was given multiple rounds of epinephrine and amiodarone.  She got ROSC 3 separate times but had been in PEA arrest for the last 30 minutes PTA.   Level 5 caveat: CPR   Past Medical History:  Diagnosis Date  . Cancer [facility]) 1974   type? s/p removal of the 3rd R rib  . Cerebrovascular accident ([place])    hx of x 2  . Diabetes mellitus   . GI bleed 08/2009   admitted to H[facility]  . History of cardiac cath 2006   @ [facility]: non critical dz, Ef 65%  . Osteoarthritis   . Osteoporosis     Patient Active Problem List   Diagnosis Date Noted  . Bronchitis 11/14/2010  . OTHER ABNORMALITY OF URINATION 02/16/2010  . SKIN LESION 11/20/2008  . OSTEOPOROSIS 07/29/2008  . HYPERLIPIDEMIA 10/02/2007  . DIABETES MELLITUS, TYPE II 03/06/2007  . HYPERTENSION 03/06/2007  . OSTEOARTHRITIS 03/06/2007  . CEREBROVASCULAR ACCIDENT, HX OF 03/06/2007    Past Surgical History:  Procedure Laterality Date  . CHOLECYSTECTOMY    . Natural birth     9  . rib removed     3rd Right rib, due to cancer  . TONSILLECTOMY      Current Outpatient Rx  . Order #: [id number] Class: Historical Med  . Order #: [id number] Class: Historical Med  . Order #: [id number] Class: Historical Med  . Order #: [id number] Class: Normal  . Order #: [id number] Class: Historical Med  . Order #: [id number] Class: Normal  . Order #: [id number] Class: Normal  . Order #:  [id number] Class: Normal  . Order #: [id number] Class: Historical Med  . Order #: [id number] Class: Phone In  . Order #: [id number] Class: Historical Med  . Order #: [id number] Class: Normal  . Order #: [id number] Class: Historical Med  . Order #: [id number] Class: Normal    Allergies Penicillins  Family History  Problem Relation Age of Onset  . Hypertension Mother   . Hypertension Sister   . Breast cancer Daughter        x 3  . Lymphoma Son   . Prostate cancer Son   . Colon cancer Neg Hx     Social History Social History   Tobacco Use  . Smoking status: Never Smoker  Substance Use Topics  . Alcohol use: Not on file  . Drug use: Not on file    Review of Systems  Level 5 caveat: CPR in progress.   ____________________________________________   PHYSICAL EXAM:  VITAL SIGNS: Pulse: 0 BP: ---  Constitutional: CPR in progress.  Eyes: Pupils are fixed and dilated.  Head: Atraumatic. Mouth/Throat: ETT in place with blood coming from tube.  Neck: No stridor.   Cardiovascular: CPR in progress. Cool extremities with cyanosis.  Respiratory: Easy to bag. No spontaneous respirations.  Gastrointestinal: Soft with mild distension.  Musculoskeletal: No gross deformities of extremities. Neurologic: GCS 2T. CPR in progress.  Skin:  Skin is cool.   ____________________________________________   PROCEDURES  Procedure(s) performed:   Procedures  Cardiopulmonary Resuscitation (CPR) Procedure Note Directed/Performed by: Margette Fast I personally directed ancillary staff and/or performed CPR in an effort to regain return of spontaneous circulation and to maintain cardiac, neuro and systemic perfusion.   ____________________________________________   INITIAL IMPRESSION / ASSESSMENT AND PLAN / ED COURSE  Pertinent labs & imaging results that were available during my care of the patient were reviewed by me and considered in my medical decision making (see chart for details).  Patient  presents to the emergency department for valuation and cardiac arrest.  The patient had an unknown downtime on scene due to husband with dementia.  No bystander CPR.  EMS with intermittent ROSC on scene but nothing maintained.  Tried additional epinephrine in the emergency department.  Bedside ultrasound shows cardiac standstill without pericardial effusion.  Ultimately, the code was stopped after final pulse check showed flat line. Time of death 20:34.   09:15 PM Spoke with the physician on call for Dr. Bebe Shaggy. They will complete the death certificate. This will not be an ME case. Discussed case with family and offered chaplain services.  ____________________________________________  FINAL CLINICAL IMPRESSION(S) / ED DIAGNOSES  Final diagnoses:  Cardiac arrest ([place])     MEDICATIONS GIVEN DURING THIS VISIT:  Medications  EPINEPHrine (ADRENALIN) 1 MG/10ML injection (1 mg Intravenous Given 04/22/2017 2014)    Note:  This document was prepared using Dragon voice recognition software and may include unintentional dictation errors.  Nina Quinton, MD Emergency Medicine    [name], [facility], MD 05/10/2017 2226

## 2017-05-13 NOTE — Code Documentation (Signed)
Patient time of death occurred at 2015/04/26.

## 2017-05-13 NOTE — Progress Notes (Signed)
I responded to ED nurse page and request for family support on patient death in tr-B.  Located family in consult area and offered pastoral support and guidance for family to visit patient.  Gathered requested information for ed staff for funeral home- [place], [place], [place].  Also next of kin are Son and D-I-L [name] and [name] [phone].  Offered prayer with family on their request, and remained present while family awaited other members to arrive.  Guided family between patient rooms on transfer from ED TR-C to F-2.  Offered prayer of blessing over patient on family request.  Informed staff that family was done visiting with pt remains.  Family expressed theiir appreciation for chaplain support.

## 2017-05-13 NOTE — ED Triage Notes (Addendum)
Per eMS:  Patient presents to ED for assessment after husband called 911 and states "my wife is dead"/  Unsure how long patient was down.  CPR began at [place].  12 doses of epi given, 450 of amio, v-fib was the prominent rhythm.  4 shocks administered during CPR.   ROSC 3 times for short periods of time with HR 20-40, unable to capture for pacing.  IO in place.  Epi drip initiated and 1,078ml of bloody suction returned en route .  CBG - 410

## 2017-05-14 MED FILL — Medication: Qty: 1 | Status: AC

## 2017-05-16 DEATH — deceased
